# Patient Record
Sex: Female | Born: 1960
Health system: Southern US, Community
[De-identification: ages and names within clinical notes are randomized; demographics above are authoritative.]

## PROBLEM LIST (undated history)

## (undated) DIAGNOSIS — T7840XA Allergy, unspecified, initial encounter: Secondary | ICD-10-CM

## (undated) DIAGNOSIS — I10 Essential (primary) hypertension: Secondary | ICD-10-CM

## (undated) DIAGNOSIS — F419 Anxiety disorder, unspecified: Secondary | ICD-10-CM

## (undated) DIAGNOSIS — F329 Major depressive disorder, single episode, unspecified: Secondary | ICD-10-CM

## (undated) DIAGNOSIS — M1990 Unspecified osteoarthritis, unspecified site: Secondary | ICD-10-CM

## (undated) DIAGNOSIS — G43909 Migraine, unspecified, not intractable, without status migrainosus: Secondary | ICD-10-CM

## (undated) DIAGNOSIS — Z91018 Allergy to other foods: Secondary | ICD-10-CM

## (undated) DIAGNOSIS — F32A Depression, unspecified: Secondary | ICD-10-CM

## (undated) HISTORY — DX: Allergy, unspecified, initial encounter: T78.40XA

## (undated) HISTORY — DX: Unspecified osteoarthritis, unspecified site: M19.90

## (undated) HISTORY — DX: Allergy to other foods: Z91.018

## (undated) HISTORY — PX: APPENDECTOMY: SHX54

## (undated) HISTORY — DX: Migraine, unspecified, not intractable, without status migrainosus: G43.909

## (undated) HISTORY — DX: Major depressive disorder, single episode, unspecified: F32.9

## (undated) HISTORY — DX: Anxiety disorder, unspecified: F41.9

## (undated) HISTORY — PX: KIDNEY SURGERY: SHX687

## (undated) HISTORY — DX: Depression, unspecified: F32.A

---

## 1984-04-28 HISTORY — PX: TONSILLECTOMY: SUR1361

## 2012-11-22 LAB — HM COLONOSCOPY

## 2015-12-04 DIAGNOSIS — N870 Mild cervical dysplasia: Secondary | ICD-10-CM | POA: Diagnosis not present

## 2015-12-04 DIAGNOSIS — Z01419 Encounter for gynecological examination (general) (routine) without abnormal findings: Secondary | ICD-10-CM | POA: Diagnosis not present

## 2015-12-04 DIAGNOSIS — R87618 Other abnormal cytological findings on specimens from cervix uteri: Secondary | ICD-10-CM | POA: Diagnosis not present

## 2016-06-03 ENCOUNTER — Ambulatory Visit: Payer: Self-pay | Admitting: Internal Medicine

## 2016-07-01 ENCOUNTER — Ambulatory Visit (INDEPENDENT_AMBULATORY_CARE_PROVIDER_SITE_OTHER): Payer: BLUE CROSS/BLUE SHIELD | Admitting: Internal Medicine

## 2016-07-01 ENCOUNTER — Encounter: Payer: Self-pay | Admitting: Internal Medicine

## 2016-07-01 ENCOUNTER — Telehealth: Payer: Self-pay | Admitting: Internal Medicine

## 2016-07-01 ENCOUNTER — Other Ambulatory Visit (INDEPENDENT_AMBULATORY_CARE_PROVIDER_SITE_OTHER): Payer: BLUE CROSS/BLUE SHIELD

## 2016-07-01 VITALS — BP 154/104 | HR 60 | Temp 97.6°F | Resp 16 | Ht 65.0 in | Wt 196.0 lb

## 2016-07-01 DIAGNOSIS — F3289 Other specified depressive episodes: Secondary | ICD-10-CM

## 2016-07-01 DIAGNOSIS — F419 Anxiety disorder, unspecified: Secondary | ICD-10-CM | POA: Diagnosis not present

## 2016-07-01 DIAGNOSIS — E6609 Other obesity due to excess calories: Secondary | ICD-10-CM | POA: Diagnosis not present

## 2016-07-01 DIAGNOSIS — G43909 Migraine, unspecified, not intractable, without status migrainosus: Secondary | ICD-10-CM | POA: Insufficient documentation

## 2016-07-01 DIAGNOSIS — E669 Obesity, unspecified: Secondary | ICD-10-CM | POA: Insufficient documentation

## 2016-07-01 DIAGNOSIS — Z833 Family history of diabetes mellitus: Secondary | ICD-10-CM | POA: Diagnosis not present

## 2016-07-01 DIAGNOSIS — Z0000 Encounter for general adult medical examination without abnormal findings: Secondary | ICD-10-CM

## 2016-07-01 DIAGNOSIS — F329 Major depressive disorder, single episode, unspecified: Secondary | ICD-10-CM | POA: Insufficient documentation

## 2016-07-01 DIAGNOSIS — J309 Allergic rhinitis, unspecified: Secondary | ICD-10-CM | POA: Diagnosis not present

## 2016-07-01 DIAGNOSIS — G43809 Other migraine, not intractable, without status migrainosus: Secondary | ICD-10-CM

## 2016-07-01 DIAGNOSIS — Z6832 Body mass index (BMI) 32.0-32.9, adult: Secondary | ICD-10-CM | POA: Diagnosis not present

## 2016-07-01 DIAGNOSIS — R030 Elevated blood-pressure reading, without diagnosis of hypertension: Secondary | ICD-10-CM | POA: Insufficient documentation

## 2016-07-01 DIAGNOSIS — F32A Depression, unspecified: Secondary | ICD-10-CM | POA: Insufficient documentation

## 2016-07-01 LAB — HEMOGLOBIN A1C: HEMOGLOBIN A1C: 5.5 % (ref 4.6–6.5)

## 2016-07-01 LAB — COMPREHENSIVE METABOLIC PANEL
ALBUMIN: 4.2 g/dL (ref 3.5–5.2)
ALT: 20 U/L (ref 0–35)
AST: 20 U/L (ref 0–37)
Alkaline Phosphatase: 74 U/L (ref 39–117)
BILIRUBIN TOTAL: 0.4 mg/dL (ref 0.2–1.2)
BUN: 14 mg/dL (ref 6–23)
CHLORIDE: 105 meq/L (ref 96–112)
CO2: 27 meq/L (ref 19–32)
CREATININE: 0.64 mg/dL (ref 0.40–1.20)
Calcium: 8.9 mg/dL (ref 8.4–10.5)
GFR: 102.35 mL/min (ref >60.00)
Glucose, Bld: 109 mg/dL — ABNORMAL HIGH (ref 70–99)
Potassium: 3.7 mEq/L (ref 3.5–5.1)
SODIUM: 139 meq/L (ref 135–145)
Total Protein: 7 g/dL (ref 6.0–8.3)

## 2016-07-01 LAB — CBC WITH DIFFERENTIAL/PLATELET
BASOS PCT: 1.3 % (ref 0.0–3.0)
Basophils Absolute: 0.1 10*3/uL (ref 0.0–0.1)
Eosinophils Absolute: 0.4 10*3/uL (ref 0.0–0.7)
Eosinophils Relative: 6.6 % — ABNORMAL HIGH (ref 0.0–5.0)
HCT: 39.2 % (ref 36.0–46.0)
Hemoglobin: 13.4 g/dL (ref 12.0–15.0)
LYMPHS ABS: 2.3 10*3/uL (ref 0.7–4.0)
Lymphocytes Relative: 36.7 % (ref 12.0–46.0)
MCHC: 34.1 g/dL (ref 30.0–36.0)
MCV: 87.6 fl (ref 78.0–100.0)
MONO ABS: 0.4 10*3/uL (ref 0.1–1.0)
Monocytes Relative: 6.5 % (ref 3.0–12.0)
NEUTROS ABS: 3.1 10*3/uL (ref 1.4–7.7)
NEUTROS PCT: 48.9 % (ref 43.0–77.0)
PLATELETS: 289 10*3/uL (ref 150.0–400.0)
RBC: 4.48 Mil/uL (ref 3.87–5.11)
RDW: 13.5 % (ref 11.5–15.5)
WBC: 6.3 10*3/uL (ref 4.0–10.5)

## 2016-07-01 LAB — LIPID PANEL
CHOL/HDL RATIO: 4
CHOLESTEROL: 170 mg/dL (ref 0–200)
HDL: 45.4 mg/dL (ref >39.00)
LDL Cholesterol: 100 mg/dL — ABNORMAL HIGH (ref 0–99)
NonHDL: 124.6
Triglycerides: 125 mg/dL (ref 0.0–149.0)
VLDL: 25 mg/dL (ref 0.0–40.0)

## 2016-07-01 LAB — TSH: TSH: 3.31 u[IU]/mL (ref 0.35–4.50)

## 2016-07-01 MED ORDER — ALPRAZOLAM 0.5 MG PO TABS: 0.5000 mg | ORAL_TABLET | Freq: Every evening | ORAL | 1 refills | Status: DC | PRN

## 2016-07-01 MED ORDER — MECLIZINE HCL 25 MG PO TABS: 25.0000 mg | ORAL_TABLET | Freq: Three times a day (TID) | ORAL | 0 refills | Status: DC | PRN

## 2016-07-01 MED ORDER — BUPROPION HCL ER (XL) 150 MG PO TB24: 150.0000 mg | ORAL_TABLET | Freq: Every day | ORAL | 1 refills | Status: DC

## 2016-07-01 NOTE — Assessment & Plan Note (Addendum)
Mild depression continue celexa 20 mg dialy Start wellbutrin 150 mg daily - she has taken this in the past and tolerated it well Can increase wellbutrin to 300 mg daily

## 2016-07-01 NOTE — Assessment & Plan Note (Signed)
Takes imitrex as needed Last migraines was three years ago

## 2016-07-01 NOTE — Assessment & Plan Note (Signed)
Takes oral anti-histamine and flonase as needed

## 2016-07-01 NOTE — Assessment & Plan Note (Signed)
Check BP at home or gym - discussed goal Start exercise again Work on weight loss Low sodium diet Check labs

## 2016-07-01 NOTE — Assessment & Plan Note (Signed)
Stressed exercise Healthy diet, decreased portions

## 2016-07-01 NOTE — Progress Notes (Signed)
Subjective:    Patient ID: Andrea Oneal, female    DOB: 1961-01-14, 56 y.o.   MRN: [id number]  HPI She is here to establish with a new pcp.  She is here for a physical exam.    She moved from [place] recently.    Her BP is higher than usual.  She has increased stress.  She does not monitor her BP.  She has not been exercising recently and has gained weight.   Anxiety: She is taking her medication daily as prescribed. She denies any side effects from the medication. She feels her anxiety is well controlled and she is happy with her current dose of medication.   Depression: She is taking her medication daily as prescribed. She has some mild depression.  She denies any side effects from the medication. She states she could have a little more for her depression.    Medications and allergies reviewed with patient and updated if appropriate.  Patient Active Problem List   Diagnosis Date Noted  . Anxiety 07/01/2016  . Depression 07/01/2016  . Allergic rhinitis 07/01/2016  . Migraines 07/01/2016  . Elevated blood pressure reading 07/01/2016    No current outpatient prescriptions on file prior to visit.   No current facility-administered medications on file prior to visit.     Past Medical History:  Diagnosis Date  . Arthritis   . Depression   . Migraine     Past Surgical History:  Procedure Laterality Date  . APPENDECTOMY    . TONSILLECTOMY  1986    Social History   Social History  . Marital status: Divorced    Spouse name: N/A  . Number of children: N/A  . Years of education: N/A   Social History Main Topics  . Smoking status: Former Games developermoker  . Smokeless tobacco: Never Used  . Alcohol use 4.2 oz/week    7 Glasses of wine per week  . Drug use: Yes    Types: Marijuana  . Sexual activity: Not Asked   Other Topics Concern  . None   Social History Narrative   Has boyfriend, his daughter just moved in [id number]    Family History  Problem Relation Age of Onset  .  Arthritis Mother   . Diabetes Mother   . Depression Mother   . Arthritis Father   . Heart disease Father   . Hypertension Father   . Arthritis Maternal Grandmother   . Diabetes Maternal Grandfather   . Arthritis Paternal Grandmother   . Depression Brother     Review of Systems  Constitutional: Negative for chills, fatigue and fever.  Eyes: Negative for visual disturbance.  Respiratory: Negative for cough, shortness of breath and wheezing.   Cardiovascular: Negative for chest pain, palpitations and leg swelling.  Gastrointestinal: Negative for abdominal pain, blood in stool, constipation, diarrhea and nausea.       Recent gerd - added stress recently  Genitourinary: Negative for dysuria and hematuria.  Musculoskeletal: Positive for arthralgias (hands, feet - mild arthritis). Negative for back pain.  Skin: Negative for color change and rash.  Neurological: Positive for dizziness (occ) and headaches (sinus, rare migraines). Negative for light-headedness.       Objective:   Vitals:   07/01/16 0905  BP: (!) 154/104  Pulse: 60  Resp: 16  Temp: 97.6 F (36.4 C)   Filed Weights   07/01/16 0905  Weight: 196 lb (88.9 kg)   Body mass index is 32.62 kg/m.  Wt Readings from  Last 3 Encounters:  07/01/16 196 lb (88.9 kg)     Physical Exam Constitutional: She appears well-developed and well-nourished. No distress.  HENT:  Head: Normocephalic and atraumatic.  Right Ear: External ear normal. Normal ear canal and TM Left Ear: External ear normal.  Normal ear canal and TM Mouth/Throat: Oropharynx is clear and moist.  Eyes: Conjunctivae and EOM are normal.  Neck: Neck supple. No tracheal deviation present. No thyromegaly present.  No carotid bruit  Cardiovascular: Normal rate, regular rhythm and normal heart sounds.   No murmur heard.  No edema. Pulmonary/Chest: Effort normal and breath sounds normal. No respiratory distress. She has no wheezes. She has no rales.  Breast:  deferred to Gyn Abdominal: Soft. She exhibits no distension. There is no tenderness.  Lymphadenopathy: She has no cervical adenopathy.  Skin: Skin is warm and dry. She is not diaphoretic.  Psychiatric: She has a normal mood and affect. Her behavior is normal.         Assessment & Plan:   Physical exam: Screening blood work   ordered Immunizations  Up to date per patient Colonoscopy - 3 years ago Mammogram  Up to date  Gyn  Up to date   Eye exams   Up to date  Exercise - none - will start - has joined [facility] Weight - discussed weight loss at length Skin  - sees derm annually Substance abuse  - none  See Problem List for Assessment and Plan of chronic medical problems.  FU in one year, sooner if needed

## 2016-07-01 NOTE — Telephone Encounter (Signed)
Pt called checking on her prescriptions that were being sent in today. I told her that it looks like [facility] would have the Wellbutrin (they show confirmation this morning at 9:49am), the Xanax would need to be picked up (I do not see a script ready for pick up) and the Antivert says "Class: No Print". Please advise. Thanks [employer]

## 2016-07-01 NOTE — Patient Instructions (Addendum)
Monitor your BP - goal is less than 130/80.  Test(s) ordered today. Your results will be released to [place] (or called to you) after review, usually within 72hours after test completion. If any changes need to be made, you will be notified at that same time.  All other Health Maintenance issues reviewed.   All recommended immunizations and age-appropriate screenings are up-to-date or discussed.  No immunizations administered today.   Medications reviewed and updated.  Changes include starting wellbutrin 150 mg daily.    Your prescription(s) have been submitted to your pharmacy. Please take as directed and contact our office if you believe you are having problem(s) with the medication(s).   Please followup in one year for a physical.    Health Maintenance, Female Adopting a healthy lifestyle and getting preventive care can go a long way to promote health and wellness. Talk with your health care provider about what schedule of regular examinations is right for you. This is a good chance for you to check in with your provider about disease prevention and staying healthy. In between checkups, there are plenty of things you can do on your own. Experts have done a lot of research about which lifestyle changes and preventive measures are most likely to keep you healthy. Ask your health care provider for more information. Weight and diet Eat a healthy diet  Be sure to include plenty of vegetables, fruits, low-fat dairy products, and lean protein.  Do not eat a lot of foods high in solid fats, added sugars, or salt.  Get regular exercise. This is one of the most important things you can do for your health.  Most adults should exercise for at least 150 minutes each week. The exercise should increase your heart rate and make you sweat (moderate-intensity exercise).  Most adults should also do strengthening exercises at least twice a week. This is in addition to the moderate-intensity  exercise. Maintain a healthy weight  Body mass index (BMI) is a measurement that can be used to identify possible weight problems. It estimates body fat based on height and weight. Your health care provider can help determine your BMI and help you achieve or maintain a healthy weight.  For females 35 years of age and older:  A BMI below 18.5 is considered underweight.  A BMI of 18.5 to 24.9 is normal.  A BMI of 25 to 29.9 is considered overweight.  A BMI of 30 and above is considered obese. Watch levels of cholesterol and blood lipids  You should start having your blood tested for lipids and cholesterol at 55 years of age, then have this test every 5 years.  You may need to have your cholesterol levels checked more often if:  Your lipid or cholesterol levels are high.  You are older than 55 years of age.  You are at high risk for heart disease. Cancer screening Lung Cancer  Lung cancer screening is recommended for adults 46-49 years old who are at high risk for lung cancer because of a history of smoking.  A yearly low-dose CT scan of the lungs is recommended for people who:  Currently smoke.  Have quit within the past 15 years.  Have at least a 30-pack-year history of smoking. A pack year is smoking an average of one pack of cigarettes a day for 1 year.  Yearly screening should continue until it has been 15 years since you quit.  Yearly screening should stop if you develop a health problem that would  prevent you from having lung cancer treatment. Breast Cancer  Practice breast self-awareness. This means understanding how your breasts normally appear and feel.  It also means doing regular breast self-exams. Let your health care provider know about any changes, no matter how small.  If you are in your 20s or 30s, you should have a clinical breast exam (CBE) by a health care provider every 1-3 years as part of a regular health exam.  If you are 63 or older, have a CBE  every year. Also consider having a breast X-ray (mammogram) every year.  If you have a family history of breast cancer, talk to your health care provider about genetic screening.  If you are at high risk for breast cancer, talk to your health care provider about having an MRI and a mammogram every year.  Breast cancer gene (BRCA) assessment is recommended for women who have family members with BRCA-related cancers. BRCA-related cancers include:  Breast.  Ovarian.  Tubal.  Peritoneal cancers.  Results of the assessment will determine the need for genetic counseling and BRCA1 and BRCA2 testing. Cervical Cancer  Your health care provider may recommend that you be screened regularly for cancer of the pelvic organs (ovaries, uterus, and vagina). This screening involves a pelvic examination, including checking for microscopic changes to the surface of your cervix (Pap test). You may be encouraged to have this screening done every 3 years, beginning at age 77.  For women ages 39-65, health care providers may recommend pelvic exams and Pap testing every 3 years, or they may recommend the Pap and pelvic exam, combined with testing for human papilloma virus (HPV), every 5 years. Some types of HPV increase your risk of cervical cancer. Testing for HPV may also be done on women of any age with unclear Pap test results.  Other health care providers may not recommend any screening for nonpregnant women who are considered low risk for pelvic cancer and who do not have symptoms. Ask your health care provider if a screening pelvic exam is right for you.  If you have had past treatment for cervical cancer or a condition that could lead to cancer, you need Pap tests and screening for cancer for at least 20 years after your treatment. If Pap tests have been discontinued, your risk factors (such as having a new sexual partner) need to be reassessed to determine if screening should resume. Some women have medical  problems that increase the chance of getting cervical cancer. In these cases, your health care provider may recommend more frequent screening and Pap tests. Colorectal Cancer  This type of cancer can be detected and often prevented.  Routine colorectal cancer screening usually begins at 55 years of age and continues through 55 years of age.  Your health care provider may recommend screening at an earlier age if you have risk factors for colon cancer.  Your health care provider may also recommend using home test kits to check for hidden blood in the stool.  A small camera at the end of a tube can be used to examine your colon directly (sigmoidoscopy or colonoscopy). This is done to check for the earliest forms of colorectal cancer.  Routine screening usually begins at age 20.  Direct examination of the colon should be repeated every 5-10 years through 55 years of age. However, you may need to be screened more often if early forms of precancerous polyps or small growths are found. Skin Cancer  Check your skin from head  to toe regularly.  Tell your health care provider about any new moles or changes in moles, especially if there is a change in a mole's shape or color.  Also tell your health care provider if you have a mole that is larger than the size of a pencil eraser.  Always use sunscreen. Apply sunscreen liberally and repeatedly throughout the day.  Protect yourself by wearing long sleeves, pants, a wide-brimmed hat, and sunglasses whenever you are outside. Heart disease, diabetes, and high blood pressure  High blood pressure causes heart disease and increases the risk of stroke. High blood pressure is more likely to develop in:  People who have blood pressure in the high end of the normal range (130-139/85-89 mm Hg).  People who are overweight or obese.  People who are African American.  If you are 69-27 years of age, have your blood pressure checked every 3-5 years. If you  are 45 years of age or older, have your blood pressure checked every year. You should have your blood pressure measured twice-once when you are at a hospital or clinic, and once when you are not at a hospital or clinic. Record the average of the two measurements. To check your blood pressure when you are not at a hospital or clinic, you can use:  An automated blood pressure machine at a pharmacy.  A home blood pressure monitor.  If you are between 75 years and 70 years old, ask your health care provider if you should take aspirin to prevent strokes.  Have regular diabetes screenings. This involves taking a blood sample to check your fasting blood sugar level.  If you are at a normal weight and have a low risk for diabetes, have this test once every three years after 55 years of age.  If you are overweight and have a high risk for diabetes, consider being tested at a younger age or more often. Preventing infection Hepatitis B  If you have a higher risk for hepatitis B, you should be screened for this virus. You are considered at high risk for hepatitis B if:  You were born in a country where hepatitis B is common. Ask your health care provider which countries are considered high risk.  Your parents were born in a high-risk country, and you have not been immunized against hepatitis B (hepatitis B vaccine).  You have HIV or AIDS.  You use needles to inject street drugs.  You live with someone who has hepatitis B.  You have had sex with someone who has hepatitis B.  You get hemodialysis treatment.  You take certain medicines for conditions, including cancer, organ transplantation, and autoimmune conditions. Hepatitis C  Blood testing is recommended for:  Everyone born from [birth year] through 1964.  Anyone with known risk factors for hepatitis C. Sexually transmitted infections (STIs)  You should be screened for sexually transmitted infections (STIs) including gonorrhea and chlamydia  if:  You are sexually active and are younger than 55 years of age.  You are older than 55 years of age and your health care provider tells you that you are at risk for this type of infection.  Your sexual activity has changed since you were last screened and you are at an increased risk for chlamydia or gonorrhea. Ask your health care provider if you are at risk.  If you do not have HIV, but are at risk, it may be recommended that you take a prescription medicine daily to prevent HIV infection. This is  called pre-exposure prophylaxis (PrEP). You are considered at risk if:  You are sexually active and do not regularly use condoms or know the HIV status of your partner(s).  You take drugs by injection.  You are sexually active with a partner who has HIV. Talk with your health care provider about whether you are at high risk of being infected with HIV. If you choose to begin PrEP, you should first be tested for HIV. You should then be tested every 3 months for as long as you are taking PrEP. Pregnancy  If you are premenopausal and you may become pregnant, ask your health care provider about preconception counseling.  If you may become pregnant, take 400 to 800 micrograms (mcg) of folic acid every day.  If you want to prevent pregnancy, talk to your health care provider about birth control (contraception). Osteoporosis and menopause  Osteoporosis is a disease in which the bones lose minerals and strength with aging. This can result in serious bone fractures. Your risk for osteoporosis can be identified using a bone density scan.  If you are 16 years of age or older, or if you are at risk for osteoporosis and fractures, ask your health care provider if you should be screened.  Ask your health care provider whether you should take a calcium or vitamin D supplement to lower your risk for osteoporosis.  Menopause may have certain physical symptoms and risks.  Hormone replacement therapy may  reduce some of these symptoms and risks. Talk to your health care provider about whether hormone replacement therapy is right for you. Follow these instructions at home:  Schedule regular health, dental, and eye exams.  Stay current with your immunizations.  Do not use any tobacco products including cigarettes, chewing tobacco, or electronic cigarettes.  If you are pregnant, do not drink alcohol.  If you are breastfeeding, limit how much and how often you drink alcohol.  Limit alcohol intake to no more than 1 drink per day for nonpregnant women. One drink equals 12 ounces of beer, 5 ounces of wine, or 1 ounces of hard liquor.  Do not use street drugs.  Do not share needles.  Ask your health care provider for help if you need support or information about quitting drugs.  Tell your health care provider if you often feel depressed.  Tell your health care provider if you have ever been abused or do not feel safe at home. This information is not intended to replace advice given to you by your health care provider. Make sure you discuss any questions you have with your health care provider. Document Released: 10/28/2010 Document Revised: 09/20/2015 Document Reviewed: 01/16/2015 Elsevier Interactive Patient Education  2017 [employer].

## 2016-07-01 NOTE — Progress Notes (Signed)
Pre visit review using our clinic review tool, if applicable. No additional management support is needed unless otherwise documented below in the visit note. 

## 2016-07-01 NOTE — Assessment & Plan Note (Signed)
Takes celexa daily Takes xanax as needed Overall controlled

## 2016-07-02 ENCOUNTER — Encounter: Payer: Self-pay | Admitting: Internal Medicine

## 2016-07-02 MED ORDER — MECLIZINE HCL 25 MG PO TABS: 25.0000 mg | ORAL_TABLET | Freq: Three times a day (TID) | ORAL | 0 refills | Status: DC | PRN

## 2016-07-02 NOTE — Telephone Encounter (Signed)
[facility] Meclizine has been re-sent. LVM informing pt.

## 2016-10-31 ENCOUNTER — Other Ambulatory Visit: Payer: Self-pay | Admitting: *Deleted

## 2016-10-31 MED ORDER — CITALOPRAM HYDROBROMIDE 20 MG PO TABS: 20.0000 mg | ORAL_TABLET | Freq: Every day | ORAL | 0 refills | Status: DC

## 2016-10-31 MED ORDER — CITALOPRAM HYDROBROMIDE 20 MG PO TABS: 20.0000 mg | ORAL_TABLET | Freq: Every day | ORAL | 2 refills | Status: DC

## 2016-10-31 NOTE — Telephone Encounter (Signed)
Rec'd call pt states she had her cpx back in March was told to contact MD for refills when she need them. Pt states she is almost out of her celexa would [facility], [facility] Verified chart pt is up-to-dated sent to both pharmacy....Raechel Chute/lmb

## 2016-11-24 DIAGNOSIS — L237 Allergic contact dermatitis due to plants, except food: Secondary | ICD-10-CM | POA: Diagnosis not present

## 2017-01-22 ENCOUNTER — Other Ambulatory Visit: Payer: Self-pay | Admitting: Internal Medicine

## 2017-01-22 NOTE — Telephone Encounter (Signed)
Ok to fill. printed 

## 2017-01-22 NOTE — Telephone Encounter (Signed)
[place] Controlled Substance Database checked. Last filled on 07/01/16. Does not show on database that there were any refills.

## 2017-01-23 NOTE — Telephone Encounter (Signed)
RX faxed to local [facility] 

## 2017-03-16 ENCOUNTER — Telehealth: Payer: Self-pay | Admitting: Internal Medicine

## 2017-03-16 NOTE — Telephone Encounter (Signed)
Pt scheduled with Dr. Posey ReaPlotnikov on 03/18/2017

## 2017-03-16 NOTE — Telephone Encounter (Signed)
[facility] - Client TELEPHONE ADVICE RECORD [facility]  Patient Name: Andrea RoughenMELISSA Oneal  DOB: 09/27/1960        Nurse Assessment  Nurse: Odis LusterBowers, RN, Andrea Oneal Date/Time Andrea Oneal(Eastern Time): 03/16/2017 10:16:40 AM  Confirm and document reason for call. If symptomatic, describe symptoms. ---Caller reports that she thinks she has a hemorrhoid. Some rectal itching. Yesterday she had bloody mucous stool, none today. She reports that she had some abd pain but none today. Worried about this.  Does the patient have any new or worsening symptoms? ---Yes  Will a triage be completed? ---Yes  Related visit to physician within the last 2 weeks? ---No  Does the PT have any chronic conditions? (i.e. diabetes, asthma, etc.) ---Yes  List chronic conditions. ---anxiety/depression  Is this a behavioral health or substance abuse call? ---No     Guidelines    Guideline Title Affirmed Question Affirmed Notes  Rectal Bleeding MODERATE rectal bleeding (small blood clots, passing blood without stool, or toilet water turns red)    Final Disposition User   See Physician within 24 Hours [place], RN, Andrea LoserRhonda    Comments  returning nurses call.   Referrals  GO TO FACILITY UNDECIDED   Caller Disagree/Comply Comply  Caller Understands Yes  PreDisposition Did not know what to do

## 2017-03-18 ENCOUNTER — Encounter: Payer: Self-pay | Admitting: Internal Medicine

## 2017-03-18 ENCOUNTER — Ambulatory Visit: Payer: BLUE CROSS/BLUE SHIELD | Admitting: Internal Medicine

## 2017-03-18 DIAGNOSIS — K648 Other hemorrhoids: Secondary | ICD-10-CM | POA: Diagnosis not present

## 2017-03-18 DIAGNOSIS — K582 Mixed irritable bowel syndrome: Secondary | ICD-10-CM | POA: Diagnosis not present

## 2017-03-18 DIAGNOSIS — K589 Irritable bowel syndrome without diarrhea: Secondary | ICD-10-CM | POA: Insufficient documentation

## 2017-03-18 MED ORDER — HYDROCORTISONE ACETATE 25 MG RE SUPP: 25.0000 mg | Freq: Two times a day (BID) | RECTAL | 1 refills | Status: AC

## 2017-03-18 NOTE — Assessment & Plan Note (Addendum)
   Anoscopy: small internal hemorrhoid at 9 o'clock  Pt is reporting diarrhea, then took Imodium - constipated. There was abd pain on the R - nl. There was a blood clot in the stool and blood stained water on Sun once - small. C/o chronic IBS type symptoms. Pt gets diarrhea from non-organic beef and pork. [place] colon was in [place].   Sx's resolved Anusol HC supp if re-occurred

## 2017-03-18 NOTE — Patient Instructions (Signed)
Hemorrhoids Hemorrhoids are swollen veins in and around the rectum or anus. There are two types of hemorrhoids:  Internal hemorrhoids. These occur in the veins that are just inside the rectum. They may poke through to the outside and become irritated and painful.  External hemorrhoids. These occur in the veins that are outside of the anus and can be felt as a painful swelling or hard lump near the anus.  Most hemorrhoids do not cause serious problems, and they can be managed with home treatments such as diet and lifestyle changes. If home treatments do not help your symptoms, procedures can be done to shrink or remove the hemorrhoids. What are the causes? This condition is caused by increased pressure in the anal area. This pressure may result from various things, including:  Constipation.  Straining to have a bowel movement.  Diarrhea.  Pregnancy.  Obesity.  Sitting for long periods of time.  Heavy lifting or other activity that causes you to strain.  Anal sex.  What are the signs or symptoms? Symptoms of this condition include:  Pain.  Anal itching or irritation.  Rectal bleeding.  Leakage of stool (feces).  Anal swelling.  One or more lumps around the anus.  How is this diagnosed? This condition can often be diagnosed through a visual exam. Other exams or tests may also be done, such as:  Examination of the rectal area with a gloved hand (digital rectal exam).  Examination of the anal canal using a small tube (anoscope).  A blood test, if you have lost a significant amount of blood.  A test to look inside the colon (sigmoidoscopy or colonoscopy).  How is this treated? This condition can usually be treated at home. However, various procedures may be done if dietary changes, lifestyle changes, and other home treatments do not help your symptoms. These procedures can help make the hemorrhoids smaller or remove them completely. Some of these procedures involve  surgery, and others do not. Common procedures include:  Rubber band ligation. Rubber bands are placed at the base of the hemorrhoids to cut off the blood supply to them.  Sclerotherapy. Medicine is injected into the hemorrhoids to shrink them.  Infrared coagulation. A type of light energy is used to get rid of the hemorrhoids.  Hemorrhoidectomy surgery. The hemorrhoids are surgically removed, and the veins that supply them are tied off.  Stapled hemorrhoidopexy surgery. A circular stapling device is used to remove the hemorrhoids and use staples to cut off the blood supply to them.  Follow these instructions at home: Eating and drinking  Eat foods that have a lot of fiber in them, such as whole grains, beans, nuts, fruits, and vegetables. Ask your health care provider about taking products that have added fiber (fiber supplements).  Drink enough fluid to keep your urine clear or pale yellow. Managing pain and swelling  Take warm sitz baths for 20 minutes, 3-4 times a day to ease pain and discomfort.  If directed, apply ice to the affected area. Using ice packs between sitz baths may be helpful. ? Put ice in a plastic bag. ? Place a towel between your skin and the bag. ? Leave the ice on for 20 minutes, 2-3 times a day. General instructions  Take over-the-counter and prescription medicines only as told by your health care provider.  Use medicated creams or suppositories as told.  Exercise regularly.  Go to the bathroom when you have the urge to have a bowel movement. Do not wait.    Avoid straining to have bowel movements.  Keep the anal area dry and clean. Use wet toilet paper or moist towelettes after a bowel movement.  Do not sit on the toilet for long periods of time. This increases blood pooling and pain. Contact a health care provider if:  You have increasing pain and swelling that are not controlled by treatment or medicine.  You have uncontrolled bleeding.  You  have difficulty having a bowel movement, or you are unable to have a bowel movement.  You have pain or inflammation outside the area of the hemorrhoids. This information is not intended to replace advice given to you by your health care provider. Make sure you discuss any questions you have with your health care provider. Document Released: 04/11/2000 Document Revised: 09/12/2015 Document Reviewed: 12/27/2014 Elsevier Interactive Patient Education  2017 Elsevier Inc.  

## 2017-03-18 NOTE — Progress Notes (Signed)
Subjective:  Patient ID: Andrea Oneal, female    DOB: [birth year]  Age: 56 y.o. MRN: [id number]  CC: No chief complaint on file.   HPI Andrea Oneal presents for a possible hemorrhoid. Pt is reporting diarrhea, then took Imodium - constipated. There was abd pain on the R - nl. There was a blood clot in the stool and blood stained water on Sun once - small. C/o chronic IBS type symptoms. Pt gets diarrhea from non-organic beef and pork. [place] colon [place] in 2014 in [place].  Outpatient Medications Prior to Visit  Medication Sig Dispense Refill  . ALPRAZolam (XANAX) 0.5 MG tablet TAKE 1 TABLET BY MOUTH AT BEDTIME AS NEEDED FOR ANXIETY 30 tablet 1  . citalopram (CELEXA) 20 MG tablet Take 1 tablet (20 mg total) by mouth daily. 30 tablet 0  . fluticasone (FLONASE) 50 MCG/ACT nasal spray Place into both nostrils daily.    . meclizine (ANTIVERT) 25 MG tablet Take 1 tablet (25 mg total) by mouth 3 (three) times daily as needed for dizziness. (Patient not taking: Reported on 03/18/2017) 30 tablet 0  . buPROPion (WELLBUTRIN XL) 150 MG 24 hr tablet Take 1 tablet (150 mg total) by mouth daily. 90 tablet 1   No facility-administered medications prior to visit.     ROS Review of Systems  Constitutional: Negative for activity change, appetite change, chills, fatigue and unexpected weight change.  HENT: Negative for congestion, mouth sores and sinus pressure.   Eyes: Negative for visual disturbance.  Respiratory: Negative for cough and chest tightness.   Gastrointestinal: Positive for blood in stool, constipation and diarrhea. Negative for abdominal distention, abdominal pain, anal bleeding, nausea and rectal pain.  Genitourinary: Negative for difficulty urinating, frequency and vaginal pain.  Musculoskeletal: Negative for back pain and gait problem.  Skin: Negative for pallor and rash.  Neurological: Negative for dizziness, tremors, weakness, numbness and headaches.  Psychiatric/Behavioral:  Negative for confusion and sleep disturbance.    Objective:  BP (!) 144/88 (BP Location: Left Arm, Patient Position: Sitting, Cuff Size: Large)   Pulse 76   Temp 98.8 F (37.1 C) (Oral)   Ht 5\' 5"  (1.651 m)   Wt 200 lb (90.7 kg)   SpO2 99%   BMI 33.28 kg/m   BP Readings from Last 3 Encounters:  03/18/17 (!) 144/88  07/01/16 (!) 154/104    Wt Readings from Last 3 Encounters:  03/18/17 200 lb (90.7 kg)  07/01/16 196 lb (88.9 kg)    Physical Exam  Constitutional: She appears well-developed. No distress.  HENT:  Head: Normocephalic.  Right Ear: External ear normal.  Left Ear: External ear normal.  Nose: Nose normal.  Mouth/Throat: Oropharynx is clear and moist.  Eyes: Conjunctivae are normal. Pupils are equal, round, and reactive to light. Right eye exhibits no discharge. Left eye exhibits no discharge.  Neck: Normal range of motion. Neck supple. No JVD present. No tracheal deviation present. No thyromegaly present.  Cardiovascular: Normal rate, regular rhythm and normal heart sounds.  Pulmonary/Chest: No stridor. No respiratory distress. She has no wheezes.  Abdominal: Soft. Bowel sounds are normal. She exhibits no distension and no mass. There is no tenderness. There is no rebound and no guarding.  Musculoskeletal: She exhibits no edema or tenderness.  Lymphadenopathy:    She has no cervical adenopathy.  Neurological: She displays normal reflexes. No cranial nerve deficit. She exhibits normal muscle tone. Coordination normal.  Skin: No rash noted. No erythema.  Psychiatric: She has a  normal mood and affect. Her behavior is normal. Judgment and thought content normal.     Procedure: Anoscopy Indication: blood in stool Risks and benefits were explained to pt in detail. He was placed in lateral decubitus position. Digital rectal exam was normal  . Stool was guaiac negative. . Anoscope was introduced without difficulty. No masses. Upon withdrawal normal mucosa was  observed. There was an internal hemorrhoid at 9 o'clock. Impression: small internal hemorrhoid at 9 o'clock Tolerated well. Complications - none.  Lab Results  Component Value Date   WBC 6.3 07/01/2016   HGB 13.4 07/01/2016   HCT 39.2 07/01/2016   PLT 289.0 07/01/2016   GLUCOSE 109 (H) 07/01/2016   CHOL 170 07/01/2016   TRIG 125.0 07/01/2016   HDL 45.40 07/01/2016   LDLCALC 100 (H) 07/01/2016   ALT 20 07/01/2016   AST 20 07/01/2016   NA 139 07/01/2016   K 3.7 07/01/2016   CL 105 07/01/2016   CREATININE 0.64 07/01/2016   BUN 14 07/01/2016   CO2 27 07/01/2016   TSH 3.31 07/01/2016   HGBA1C 5.5 07/01/2016    Patient was never admitted.  Assessment & Plan:   There are no diagnoses linked to this encounter. I have discontinued Andrea Oneal's buPROPion. I am also having her maintain her meclizine, citalopram, ALPRAZolam, and fluticasone.  Meds ordered this encounter  Medications  . fluticasone (FLONASE) 50 MCG/ACT nasal spray    Sig: Place into both nostrils daily.     Follow-up: No Follow-up on file.  Sonda PrimesAlex Plotnikov, MD

## 2017-03-18 NOTE — Assessment & Plan Note (Signed)
Anoscopy: small internal hemorrhoid at 9 o'clock C/o chronic IBS type symptoms. Pt gets diarrhea from non-organic beef and pork. Discussed [place] colon was in 2014 in [place]. F/u w/Dr Lawerance BachBurns in 6 weeks

## 2017-07-28 ENCOUNTER — Other Ambulatory Visit: Payer: Self-pay | Admitting: Internal Medicine

## 2017-08-05 ENCOUNTER — Other Ambulatory Visit: Payer: Self-pay | Admitting: Internal Medicine

## 2017-08-05 MED ORDER — ALPRAZOLAM 0.5 MG PO TABS: 0.5000 mg | ORAL_TABLET | Freq: Every day | ORAL | 0 refills | Status: DC

## 2017-08-05 NOTE — Telephone Encounter (Signed)
[place] Controlled Substance Database checked. Last filled on 01/23/17 

## 2017-10-26 ENCOUNTER — Other Ambulatory Visit: Payer: Self-pay | Admitting: Internal Medicine

## 2017-11-04 DIAGNOSIS — J00 Acute nasopharyngitis [common cold]: Secondary | ICD-10-CM | POA: Diagnosis not present

## 2017-12-30 DIAGNOSIS — L237 Allergic contact dermatitis due to plants, except food: Secondary | ICD-10-CM | POA: Diagnosis not present

## 2018-01-19 ENCOUNTER — Encounter: Payer: BLUE CROSS/BLUE SHIELD | Admitting: Internal Medicine

## 2018-02-09 ENCOUNTER — Encounter: Payer: BLUE CROSS/BLUE SHIELD | Admitting: Internal Medicine

## 2018-02-19 ENCOUNTER — Encounter: Payer: BLUE CROSS/BLUE SHIELD | Admitting: Internal Medicine

## 2018-03-04 NOTE — Patient Instructions (Addendum)
[facility]  [address]  [place]  [place], [place] [address]  Main: [phone]   Tests ordered today. Your results will be released to [facility] (or called to you) after review, usually within 72hours after test completion. If any changes need to be made, you will be notified at that same time.  All other Health Maintenance issues reviewed.   All recommended immunizations and age-appropriate screenings are up-to-date or discussed.  No immunizations administered today.   Medications reviewed and updated.  Changes include :   Take celexa 10 mg daily for a few day to one week and then stop.  Start venlafaxine 37.5 mg daily.    Your prescription(s) have been submitted to your pharmacy. Please take as directed and contact our office if you believe you are having problem(s) with the medication(s).   Please followup in 6 months   Health Maintenance, Female Adopting a healthy lifestyle and getting preventive care can go a long way to promote health and wellness. Talk with your health care provider about what schedule of regular examinations is right for you. This is a good chance for you to check in with your provider about disease prevention and staying healthy. In between checkups, there are plenty of things you can do on your own. Experts have done a lot of research about which lifestyle changes and preventive measures are most likely to keep you healthy. Ask your health care provider for more information. Weight and diet Eat a healthy diet  Be sure to include plenty of vegetables, fruits, low-fat dairy products, and lean protein.  Do not eat a lot of foods high in solid fats, added sugars, or salt.  Get regular exercise. This is one of the most important things you can do for your health. ? Most adults should exercise for at least 150 minutes each week. The exercise should increase your heart rate and make you sweat (moderate-intensity exercise). ? Most adults  should also do strengthening exercises at least twice a week. This is in addition to the moderate-intensity exercise.  Maintain a healthy weight  Body mass index (BMI) is a measurement that can be used to identify possible weight problems. It estimates body fat based on height and weight. Your health care provider can help determine your BMI and help you achieve or maintain a healthy weight.  For females 2 years of age and older: ? A BMI below 18.5 is considered underweight. ? A BMI of 18.5 to 24.9 is normal. ? A BMI of 25 to 29.9 is considered overweight. ? A BMI of 30 and above is considered obese.  Watch levels of cholesterol and blood lipids  You should start having your blood tested for lipids and cholesterol at 56 years of age, then have this test every 5 years.  You may need to have your cholesterol levels checked more often if: ? Your lipid or cholesterol levels are high. ? You are older than 56 years of age. ? You are at high risk for heart disease.  Cancer screening Lung Cancer  Lung cancer screening is recommended for adults 33-37 years old who are at high risk for lung cancer because of a history of smoking.  A yearly low-dose CT scan of the lungs is recommended for people who: ? Currently smoke. ? Have quit within the past 15 years. ? Have at least a 30-pack-year history of smoking. A pack year is smoking an average of one pack of cigarettes a day for  1 year.  Yearly screening should continue until it has been 15 years since you quit.  Yearly screening should stop if you develop a health problem that would prevent you from having lung cancer treatment.  Breast Cancer  Practice breast self-awareness. This means understanding how your breasts normally appear and feel.  It also means doing regular breast self-exams. Let your health care provider know about any changes, no matter how small.  If you are in your 20s or 30s, you should have a clinical breast exam (CBE)  by a health care provider every 1-3 years as part of a regular health exam.  If you are 69 or older, have a CBE every year. Also consider having a breast X-ray (mammogram) every year.  If you have a family history of breast cancer, talk to your health care provider about genetic screening.  If you are at high risk for breast cancer, talk to your health care provider about having an MRI and a mammogram every year.  Breast cancer gene (BRCA) assessment is recommended for women who have family members with BRCA-related cancers. BRCA-related cancers include: ? Breast. ? Ovarian. ? Tubal. ? Peritoneal cancers.  Results of the assessment will determine the need for genetic counseling and BRCA1 and BRCA2 testing.  Cervical Cancer Your health care provider may recommend that you be screened regularly for cancer of the pelvic organs (ovaries, uterus, and vagina). This screening involves a pelvic examination, including checking for microscopic changes to the surface of your cervix (Pap test). You may be encouraged to have this screening done every 3 years, beginning at age 33.  For women ages 109-65, health care providers may recommend pelvic exams and Pap testing every 3 years, or they may recommend the Pap and pelvic exam, combined with testing for human papilloma virus (HPV), every 5 years. Some types of HPV increase your risk of cervical cancer. Testing for HPV may also be done on women of any age with unclear Pap test results.  Other health care providers may not recommend any screening for nonpregnant women who are considered low risk for pelvic cancer and who do not have symptoms. Ask your health care provider if a screening pelvic exam is right for you.  If you have had past treatment for cervical cancer or a condition that could lead to cancer, you need Pap tests and screening for cancer for at least 20 years after your treatment. If Pap tests have been discontinued, your risk factors (such as  having a new sexual partner) need to be reassessed to determine if screening should resume. Some women have medical problems that increase the chance of getting cervical cancer. In these cases, your health care provider may recommend more frequent screening and Pap tests.  Colorectal Cancer  This type of cancer can be detected and often prevented.  Routine colorectal cancer screening usually begins at 56 years of age and continues through 56 years of age.  Your health care provider may recommend screening at an earlier age if you have risk factors for colon cancer.  Your health care provider may also recommend using home test kits to check for hidden blood in the stool.  A small camera at the end of a tube can be used to examine your colon directly (sigmoidoscopy or colonoscopy). This is done to check for the earliest forms of colorectal cancer.  Routine screening usually begins at age 5.  Direct examination of the colon should be repeated every 5-10 years through 56  years of age. However, you may need to be screened more often if early forms of precancerous polyps or small growths are found.  Skin Cancer  Check your skin from head to toe regularly.  Tell your health care provider about any new moles or changes in moles, especially if there is a change in a mole's shape or color.  Also tell your health care provider if you have a mole that is larger than the size of a pencil eraser.  Always use sunscreen. Apply sunscreen liberally and repeatedly throughout the day.  Protect yourself by wearing long sleeves, pants, a wide-brimmed hat, and sunglasses whenever you are outside.  Heart disease, diabetes, and high blood pressure  High blood pressure causes heart disease and increases the risk of stroke. High blood pressure is more likely to develop in: ? People who have blood pressure in the high end of the normal range (130-139/85-89 mm Hg). ? People who are overweight or  obese. ? People who are African American.  If you are 82-93 years of age, have your blood pressure checked every 3-5 years. If you are 35 years of age or older, have your blood pressure checked every year. You should have your blood pressure measured twice-once when you are at a hospital or clinic, and once when you are not at a hospital or clinic. Record the average of the two measurements. To check your blood pressure when you are not at a hospital or clinic, you can use: ? An automated blood pressure machine at a pharmacy. ? A home blood pressure monitor.  If you are between 89 years and 74 years old, ask your health care provider if you should take aspirin to prevent strokes.  Have regular diabetes screenings. This involves taking a blood sample to check your fasting blood sugar level. ? If you are at a normal weight and have a low risk for diabetes, have this test once every three years after 56 years of age. ? If you are overweight and have a high risk for diabetes, consider being tested at a younger age or more often. Preventing infection Hepatitis B  If you have a higher risk for hepatitis B, you should be screened for this virus. You are considered at high risk for hepatitis B if: ? You were born in a country where hepatitis B is common. Ask your health care provider which countries are considered high risk. ? Your parents were born in a high-risk country, and you have not been immunized against hepatitis B (hepatitis B vaccine). ? You have HIV or AIDS. ? You use needles to inject street drugs. ? You live with someone who has hepatitis B. ? You have had sex with someone who has hepatitis B. ? You get hemodialysis treatment. ? You take certain medicines for conditions, including cancer, organ transplantation, and autoimmune conditions.  Hepatitis C  Blood testing is recommended for: ? Everyone born from [birth year] through 1964. ? Anyone with known risk factors for hepatitis  C.  Sexually transmitted infections (STIs)  You should be screened for sexually transmitted infections (STIs) including gonorrhea and chlamydia if: ? You are sexually active and are younger than 56 years of age. ? You are older than 56 years of age and your health care provider tells you that you are at risk for this type of infection. ? Your sexual activity has changed since you were last screened and you are at an increased risk for chlamydia or gonorrhea. Ask your health  care provider if you are at risk.  If you do not have HIV, but are at risk, it may be recommended that you take a prescription medicine daily to prevent HIV infection. This is called pre-exposure prophylaxis (PrEP). You are considered at risk if: ? You are sexually active and do not regularly use condoms or know the HIV status of your partner(s). ? You take drugs by injection. ? You are sexually active with a partner who has HIV.  Talk with your health care provider about whether you are at high risk of being infected with HIV. If you choose to begin PrEP, you should first be tested for HIV. You should then be tested every 3 months for as long as you are taking PrEP. Pregnancy  If you are premenopausal and you may become pregnant, ask your health care provider about preconception counseling.  If you may become pregnant, take 400 to 800 micrograms (mcg) of folic acid every day.  If you want to prevent pregnancy, talk to your health care provider about birth control (contraception). Osteoporosis and menopause  Osteoporosis is a disease in which the bones lose minerals and strength with aging. This can result in serious bone fractures. Your risk for osteoporosis can be identified using a bone density scan.  If you are 7 years of age or older, or if you are at risk for osteoporosis and fractures, ask your health care provider if you should be screened.  Ask your health care provider whether you should take a calcium or  vitamin D supplement to lower your risk for osteoporosis.  Menopause may have certain physical symptoms and risks.  Hormone replacement therapy may reduce some of these symptoms and risks. Talk to your health care provider about whether hormone replacement therapy is right for you. Follow these instructions at home:  Schedule regular health, dental, and eye exams.  Stay current with your immunizations.  Do not use any tobacco products including cigarettes, chewing tobacco, or electronic cigarettes.  If you are pregnant, do not drink alcohol.  If you are breastfeeding, limit how much and how often you drink alcohol.  Limit alcohol intake to no more than 1 drink per day for nonpregnant women. One drink equals 12 ounces of beer, 5 ounces of wine, or 1 ounces of hard liquor.  Do not use street drugs.  Do not share needles.  Ask your health care provider for help if you need support or information about quitting drugs.  Tell your health care provider if you often feel depressed.  Tell your health care provider if you have ever been abused or do not feel safe at home. This information is not intended to replace advice given to you by your health care provider. Make sure you discuss any questions you have with your health care provider. Document Released: 10/28/2010 Document Revised: 09/20/2015 Document Reviewed: 01/16/2015 Elsevier Interactive Patient Education  [employer].

## 2018-03-04 NOTE — Progress Notes (Signed)
Subjective:    Patient ID: Andrea Oneal, female    DOB: 04/14/1961, 57 y.o.   MRN: [id number]  HPI She is here for a physical exam.   Her eczema has flared up.  She has a steroid creams in the past and they did help.  She probably needs to go back to see the dermatologist, but would ideally like to get a prescription until she is able to set up an appointment.  Increased stress: She has been experiencing increased stress over the past few months.  Her boyfriend has multiple medical problems and she has been caring for him.  She is taking her medication daily as prescribed.  She rarely uses the Xanax.  She has not been exercising regularly and has gained weight.  She is also been drinking more alcohol compensation.  She is not happy about her weight.  Medications and allergies reviewed with patient and updated if appropriate.  Patient Active Problem List   Diagnosis Date Noted  . Internal hemorrhoid 03/18/2017  . IBS (irritable bowel syndrome) 03/18/2017  . Anxiety 07/01/2016  . Depression 07/01/2016  . Allergic rhinitis 07/01/2016  . Migraines 07/01/2016  . Elevated blood pressure reading 07/01/2016  . Obese 07/01/2016    Current Outpatient Medications on File Prior to Visit  Medication Sig Dispense Refill  . ALPRAZolam (XANAX) 0.5 MG tablet Take 1 tablet (0.5 mg total) by mouth at bedtime. -- Office visit needed for further refills 30 tablet 0  . citalopram (CELEXA) 20 MG tablet Take 1 tablet (20 mg total) by mouth daily. -- Office visit needed for further refills 90 tablet 0  . fluticasone (FLONASE) 50 MCG/ACT nasal spray Place into both nostrils daily.    . hydrocortisone (ANUSOL-HC) 25 MG suppository Place 1 suppository (25 mg total) rectally 2 (two) times daily. 20 suppository 1  . meclizine (ANTIVERT) 25 MG tablet Take 1 tablet (25 mg total) by mouth 3 (three) times daily as needed for dizziness. 30 tablet 0   No current facility-administered medications on file prior to  visit.     Past Medical History:  Diagnosis Date  . Arthritis   . Depression   . Migraine     Past Surgical History:  Procedure Laterality Date  . APPENDECTOMY    . TONSILLECTOMY  1986    Social History   Socioeconomic History  . Marital status: Divorced    Spouse name: Not on file  . Number of children: Not on file  . Years of education: Not on file  . Highest education level: Not on file  Occupational History  . Not on file  Social Needs  . Financial resource strain: Not on file  . Food insecurity:    Worry: Not on file    Inability: Not on file  . Transportation needs:    Medical: Not on file    Non-medical: Not on file  Tobacco Use  . Smoking status: Former Games developer  . Smokeless tobacco: Never Used  Substance and Sexual Activity  . Alcohol use: Yes    Alcohol/week: 7.0 standard drinks    Types: 7 Glasses of wine per week  . Drug use: Yes    Types: Marijuana  . Sexual activity: Not on file  Lifestyle  . Physical activity:    Days per week: Not on file    Minutes per session: Not on file  . Stress: Not on file  Relationships  . Social connections:    Talks on phone: Not on  file    Gets together: Not on file    Attends religious service: Not on file    Active member of club or organization: Not on file    Attends meetings of clubs or organizations: Not on file    Relationship status: Not on file  Other Topics Concern  . Not on file  Social History Narrative   Has boyfriend, his daughter just moved in -23    Family History  Problem Relation Age of Onset  . Arthritis Mother   . Diabetes Mother   . Depression Mother   . Arthritis Father   . Heart disease Father   . Hypertension Father   . Arthritis Maternal Grandmother   . Diabetes Maternal Grandfather   . Arthritis Paternal Grandmother   . Depression Brother     Review of Systems  Constitutional: Negative for chills and fever.  Eyes: Negative for visual disturbance.  Respiratory: Negative  for cough, shortness of breath and wheezing.   Cardiovascular: Negative for chest pain, palpitations and leg swelling.  Gastrointestinal: Positive for diarrhea (occ). Negative for abdominal pain, blood in stool, constipation and nausea.       No gerd  Genitourinary: Negative for dysuria and hematuria.  Musculoskeletal: Positive for arthralgias (arthritis).  Skin: Positive for rash (eczema).  Neurological: Positive for dizziness (rare) and headaches (occasional).  Psychiatric/Behavioral: Positive for dysphoric mood. The patient is nervous/anxious.        Objective:   Vitals:   03/05/18 1411  BP: (!) 144/90  Pulse: 66  Resp: 16  Temp: 98.6 F (37 C)  SpO2: 96%   Filed Weights   03/05/18 1411  Weight: 200 lb (90.7 kg)   Body mass index is 33.28 kg/m.  BP Readings from Last 3 Encounters:  03/05/18 (!) 144/90  03/18/17 (!) 144/88  07/01/16 (!) 154/104    Wt Readings from Last 3 Encounters:  03/05/18 200 lb (90.7 kg)  03/18/17 200 lb (90.7 kg)  07/01/16 196 lb (88.9 kg)     Physical Exam Constitutional: She appears well-developed and well-nourished. No distress.  HENT:  Head: Normocephalic and atraumatic.  Right Ear: External ear normal. Normal ear canal and TM Left Ear: External ear normal.  Normal ear canal and TM Mouth/Throat: Oropharynx is clear and moist.  Eyes: Conjunctivae and EOM are normal.  Neck: Neck supple. No tracheal deviation present. No thyromegaly present.  No carotid bruit  Cardiovascular: Normal rate, regular rhythm and normal heart sounds.   No murmur heard.  No edema. Pulmonary/Chest: Effort normal and breath sounds normal. No respiratory distress. She has no wheezes. She has no rales.  Breast: deferred to Gyn Abdominal: Soft. She exhibits no distension. There is no tenderness.  Lymphadenopathy: She has no cervical adenopathy.  Skin: Skin is warm and dry. She is not diaphoretic.  Patches of eczema-dry skin on back and behind Ears  especially Psychiatric: She has a normal mood and affect. Her behavior is normal.        Assessment & Plan:   Physical exam: Screening blood work  ordered Immunizations  Td up to date, flu shot up to date Colonoscopy   5 years ago 11/18/2012 Mammogram   Scheduled for November Gyn  - will establish with one - last saw in 2017 Eye exams  Up to date  Exercise none currently, but will restart Weight   discussed weight loss at length Skin   Sees derm annually Substance abuse   drinking too much alcohol-discussed the need to  decrease because of the concern of using it as a compensation for increased stress and he has it is contributing to her weight gain  See Problem List for Assessment and Plan of chronic medical problems.

## 2018-03-05 ENCOUNTER — Other Ambulatory Visit (INDEPENDENT_AMBULATORY_CARE_PROVIDER_SITE_OTHER): Payer: BLUE CROSS/BLUE SHIELD

## 2018-03-05 ENCOUNTER — Encounter: Payer: Self-pay | Admitting: Internal Medicine

## 2018-03-05 ENCOUNTER — Ambulatory Visit (INDEPENDENT_AMBULATORY_CARE_PROVIDER_SITE_OTHER): Payer: BLUE CROSS/BLUE SHIELD | Admitting: Internal Medicine

## 2018-03-05 VITALS — BP 144/90 | HR 66 | Temp 98.6°F | Resp 16 | Ht 65.0 in | Wt 200.0 lb

## 2018-03-05 DIAGNOSIS — R7303 Prediabetes: Secondary | ICD-10-CM | POA: Insufficient documentation

## 2018-03-05 DIAGNOSIS — Z0000 Encounter for general adult medical examination without abnormal findings: Secondary | ICD-10-CM

## 2018-03-05 DIAGNOSIS — L309 Dermatitis, unspecified: Secondary | ICD-10-CM | POA: Diagnosis not present

## 2018-03-05 DIAGNOSIS — R739 Hyperglycemia, unspecified: Secondary | ICD-10-CM | POA: Diagnosis not present

## 2018-03-05 DIAGNOSIS — R030 Elevated blood-pressure reading, without diagnosis of hypertension: Secondary | ICD-10-CM | POA: Diagnosis not present

## 2018-03-05 DIAGNOSIS — F419 Anxiety disorder, unspecified: Secondary | ICD-10-CM

## 2018-03-05 DIAGNOSIS — F3289 Other specified depressive episodes: Secondary | ICD-10-CM | POA: Diagnosis not present

## 2018-03-05 LAB — CBC WITH DIFFERENTIAL/PLATELET
BASOS ABS: 0.1 10*3/uL (ref 0.0–0.1)
Basophils Relative: 1.5 % (ref 0.0–3.0)
Eosinophils Absolute: 0.3 10*3/uL (ref 0.0–0.7)
Eosinophils Relative: 5.4 % — ABNORMAL HIGH (ref 0.0–5.0)
HEMATOCRIT: 40.8 % (ref 36.0–46.0)
Hemoglobin: 13.9 g/dL (ref 12.0–15.0)
LYMPHS PCT: 32.6 % (ref 12.0–46.0)
Lymphs Abs: 2.1 10*3/uL (ref 0.7–4.0)
MCHC: 34.1 g/dL (ref 30.0–36.0)
MCV: 88.7 fl (ref 78.0–100.0)
MONOS PCT: 6.4 % (ref 3.0–12.0)
Monocytes Absolute: 0.4 10*3/uL (ref 0.1–1.0)
NEUTROS PCT: 54.1 % (ref 43.0–77.0)
Neutro Abs: 3.5 10*3/uL (ref 1.4–7.7)
Platelets: 264 10*3/uL (ref 150.0–400.0)
RBC: 4.6 Mil/uL (ref 3.87–5.11)
RDW: 13.3 % (ref 11.5–15.5)
WBC: 6.5 10*3/uL (ref 4.0–10.5)

## 2018-03-05 LAB — COMPREHENSIVE METABOLIC PANEL
ALBUMIN: 4.5 g/dL (ref 3.5–5.2)
ALK PHOS: 67 U/L (ref 39–117)
ALT: 24 U/L (ref 0–35)
AST: 23 U/L (ref 0–37)
BILIRUBIN TOTAL: 0.5 mg/dL (ref 0.2–1.2)
BUN: 11 mg/dL (ref 6–23)
CALCIUM: 9.3 mg/dL (ref 8.4–10.5)
CO2: 31 mEq/L (ref 19–32)
Chloride: 102 mEq/L (ref 96–112)
Creatinine, Ser: 0.71 mg/dL (ref 0.40–1.20)
GFR: 90.25 mL/min (ref >60.00)
Glucose, Bld: 95 mg/dL (ref 70–99)
Potassium: 4.3 mEq/L (ref 3.5–5.1)
Sodium: 139 mEq/L (ref 135–145)
TOTAL PROTEIN: 7.4 g/dL (ref 6.0–8.3)

## 2018-03-05 LAB — TSH: TSH: 2.2 u[IU]/mL (ref 0.35–4.50)

## 2018-03-05 LAB — LIPID PANEL
CHOL/HDL RATIO: 4
Cholesterol: 194 mg/dL (ref 0–200)
HDL: 49.9 mg/dL (ref >39.00)
LDL Cholesterol: 123 mg/dL — ABNORMAL HIGH (ref 0–99)
NonHDL: 144.24
TRIGLYCERIDES: 104 mg/dL (ref 0.0–149.0)
VLDL: 20.8 mg/dL (ref 0.0–40.0)

## 2018-03-05 LAB — HEMOGLOBIN A1C: Hgb A1c MFr Bld: 5.5 % (ref 4.6–6.5)

## 2018-03-05 MED ORDER — ALPRAZOLAM 0.5 MG PO TABS: 0.5000 mg | ORAL_TABLET | Freq: Every day | ORAL | 0 refills | Status: DC

## 2018-03-05 MED ORDER — VENLAFAXINE HCL ER 37.5 MG PO CP24: 37.5000 mg | ORAL_CAPSULE | Freq: Every day | ORAL | 5 refills | Status: DC

## 2018-03-05 MED ORDER — DESOXIMETASONE 0.05 % EX CREA: TOPICAL_CREAM | Freq: Two times a day (BID) | CUTANEOUS | 0 refills | Status: DC

## 2018-03-05 NOTE — Assessment & Plan Note (Addendum)
Has had increased anxiety Has compensated with increasing her alcohol intake we discussed this-she will try to decrease it ?  Celexa working as good as it could be-we will taper off and start Effexor 37.5 mg daily-she will let me know if she wants to increase this Takes xanax only as needed, continue Discussed regular exercise and other ways of compensating for the stress

## 2018-03-05 NOTE — Assessment & Plan Note (Addendum)
BP at home < 140/70-80 She will continue to monitor at home Stressed weight loss Start regular exercise Improve diet Follow-up in 6 months

## 2018-03-06 NOTE — Assessment & Plan Note (Signed)
She has been using moisturizer cream without improvement In the past she used Topicort cream-we will refill she will make an appointment with dermatologist Continue daily moisturizer creams

## 2018-03-06 NOTE — Assessment & Plan Note (Signed)
A1c Discussed the importance of improving her diet, working on weight loss and exercising regularly

## 2018-03-06 NOTE — Assessment & Plan Note (Signed)
Depression is not as much of an issue his anxiety is We will change Celexa to Effexor for anxiety Start Effexor 37.5 mg daily

## 2018-03-07 ENCOUNTER — Encounter: Payer: Self-pay | Admitting: Internal Medicine

## 2018-03-09 ENCOUNTER — Encounter: Payer: Self-pay | Admitting: Internal Medicine

## 2018-03-09 ENCOUNTER — Telehealth: Payer: Self-pay | Admitting: *Deleted

## 2018-03-09 NOTE — Telephone Encounter (Signed)
Desoximetasone 0.05% PA is approved via CoverMyMeds.  KeyVonda [place]: [id number] - PA Case ID: [id number] Pharmacy and patient informed.

## 2018-03-17 ENCOUNTER — Other Ambulatory Visit: Payer: Self-pay | Admitting: Internal Medicine

## 2018-03-17 DIAGNOSIS — Z1231 Encounter for screening mammogram for malignant neoplasm of breast: Secondary | ICD-10-CM

## 2018-03-22 ENCOUNTER — Other Ambulatory Visit: Payer: Self-pay | Admitting: Internal Medicine

## 2018-03-22 ENCOUNTER — Ambulatory Visit
Admission: RE | Admit: 2018-03-22 | Discharge: 2018-03-22 | Disposition: A | Discharge status: Home / Self Care | Payer: BLUE CROSS/BLUE SHIELD | Source: Ambulatory Visit | Attending: Internal Medicine | Admitting: Internal Medicine

## 2018-03-22 DIAGNOSIS — Z1231 Encounter for screening mammogram for malignant neoplasm of breast: Secondary | ICD-10-CM | POA: Diagnosis not present

## 2018-03-30 ENCOUNTER — Other Ambulatory Visit: Payer: Self-pay | Admitting: Internal Medicine

## 2018-04-26 ENCOUNTER — Telehealth: Payer: BLUE CROSS/BLUE SHIELD | Admitting: Family

## 2018-04-26 DIAGNOSIS — B9689 Other specified bacterial agents as the cause of diseases classified elsewhere: Secondary | ICD-10-CM

## 2018-04-26 DIAGNOSIS — J208 Acute bronchitis due to other specified organisms: Secondary | ICD-10-CM

## 2018-04-26 MED ORDER — ALBUTEROL SULFATE HFA 108 (90 BASE) MCG/ACT IN AERS: 2.0000 | INHALATION_SPRAY | Freq: Four times a day (QID) | RESPIRATORY_TRACT | 0 refills | Status: DC | PRN

## 2018-04-26 MED ORDER — AZITHROMYCIN 250 MG PO TABS: ORAL_TABLET | ORAL | 0 refills | Status: DC

## 2018-04-26 MED ORDER — PREDNISONE 5 MG PO TABS: 5.0000 mg | ORAL_TABLET | ORAL | 0 refills | Status: DC

## 2018-04-26 MED ORDER — BENZONATATE 100 MG PO CAPS: 100.0000 mg | ORAL_CAPSULE | Freq: Three times a day (TID) | ORAL | 0 refills | Status: DC | PRN

## 2018-04-26 NOTE — Addendum Note (Signed)
Addended by: Beau FannyWITHROW, Danise Dehne C on: 04/26/2018 01:47 PM   Modules accepted: Orders

## 2018-04-26 NOTE — Progress Notes (Signed)
ADDENDUM: Pt reports updated symptoms of 2 weeks now. Will change to bacterial treatment plan.  We are sorry that you are not feeling well.  Here is how we plan to help!  Based on your presentation I believe you most likely have A cough due to bacteria.  When patients have a fever and a productive cough with a change in color or increased sputum production, we are concerned about bacterial bronchitis.  If left untreated it can progress to pneumonia.  If your symptoms do not improve with your treatment plan it is important that you contact your provider.   I have prescribed Azithromyin 250 mg: two tablets now and then one tablet daily for 4 additonal days    In addition you may use A non-prescription cough medication called Mucinex DM: take 2 tablets every 12 hours.  Prednisone 5 mg daily for 6 days (see taper instructions below)  Directions for 6 day taper: Day 1: 2 tablets before breakfast, 1 after both lunch & dinner and 2 at bedtime Day 2: 1 tab before breakfast, 1 after both lunch & dinner and 2 at bedtime Day 3: 1 tab at each meal & 1 at bedtime Day 4: 1 tab at breakfast, 1 at lunch, 1 at bedtime Day 5: 1 tab at breakfast & 1 tab at bedtime Day 6: 1 tab at breakfast   From your responses in the eVisit questionnaire you describe inflammation in the upper respiratory tract which is causing a significant cough.  This is commonly called Bronchitis and has four common causes:    Allergies  Viral Infections  Acid Reflux  Bacterial Infection Allergies, viruses and acid reflux are treated by controlling symptoms or eliminating the cause. An example might be a cough caused by taking certain blood pressure medications. You stop the cough by changing the medication. Another example might be a cough caused by acid reflux. Controlling the reflux helps control the cough.  USE OF BRONCHODILATOR ("RESCUE") INHALERS: There is a risk from using your bronchodilator too frequently.  The risk is that  over-reliance on a medication which only relaxes the muscles surrounding the breathing tubes can reduce the effectiveness of medications prescribed to reduce swelling and congestion of the tubes themselves.  Although you feel brief relief from the bronchodilator inhaler, your asthma may actually be worsening with the tubes becoming more swollen and filled with mucus.  This can delay other crucial treatments, such as oral steroid medications. If you need to use a bronchodilator inhaler daily, several times per day, you should discuss this with your provider.  There are probably better treatments that could be used to keep your asthma under control.     HOME CARE . Only take medications as instructed by your medical team. . Complete the entire course of an antibiotic. . Drink plenty of fluids and get plenty of rest. . Avoid close contacts especially the very young and the elderly . Cover your mouth if you cough or cough into your sleeve. . Always remember to wash your hands . A steam or ultrasonic humidifier can help congestion.   GET HELP RIGHT AWAY IF: . You develop worsening fever. . You become short of breath . You cough up blood. . Your symptoms persist after you have completed your treatment plan MAKE SURE YOU   Understand these instructions.  Will watch your condition.  Will get help right away if you are not doing well or get worse.  Your e-visit answers were reviewed by a  board certified advanced clinical practitioner to complete your personal care plan.  Depending on the condition, your plan could have included both over the counter or prescription medications. If there is a problem please reply  once you have received a response from your provider. Your safety is important to us.  If you have drug allergies check your prescription carefully.    You can use MyChart to ask questions about today's visit, request a non-urgent call back, or ask for a work or school excuse for 24 hours  related to this e-Visit. If it has been greater than 24 hours you will need to follow up with your provider, or enter a new e-Visit to address those concerns. You will get an e-mail in the next two days asking about your experience.  I hope that your e-visit has been valuable and will speed your recovery. Thank you for using e-visits.

## 2018-04-26 NOTE — Progress Notes (Signed)
Thank you for the details you included in the comment boxes. Those details are very helpful in determining the best course of treatment for you and help us to provide the best care. This is most likely a viral infection that must run its course, but is likely not influenza. Also, after 48-72h, the flu treatment is far less effective. If you have been sick 5-7 days, please reply to this and update our team and we will modify. See plan below.   Also, if the diarrhea is more then 3 times and continues, please do another e-visit so that we have a record (this is a [facility] policy for documentation and safety reasons to provide the best care to patients)  We are sorry that you are not feeling well.  Here is how we plan to help!  Based on your presentation I believe you most likely have A cough due to a virus.  This is called viral bronchitis and is best treated by rest, plenty of fluids and control of the cough.  You may use Ibuprofen or Tylenol as directed to help your symptoms.     In addition you may use A non-prescription cough medication called Mucinex DM: take 2 tablets every 12 hours. and A prescription cough medication called Tessalon Perles 100mg . You may take 1-2 capsules every 8 hours as needed for your cough.   From your responses in the eVisit questionnaire you describe inflammation in the upper respiratory tract which is causing a significant cough.  This is commonly called Bronchitis and has four common causes:    Allergies  Viral Infections  Acid Reflux  Bacterial Infection Allergies, viruses and acid reflux are treated by controlling symptoms or eliminating the cause. An example might be a cough caused by taking certain blood pressure medications. You stop the cough by changing the medication. Another example might be a cough caused by acid reflux. Controlling the reflux helps control the cough.  USE OF BRONCHODILATOR ("RESCUE") INHALERS: There is a risk from using your bronchodilator  too frequently.  The risk is that over-reliance on a medication which only relaxes the muscles surrounding the breathing tubes can reduce the effectiveness of medications prescribed to reduce swelling and congestion of the tubes themselves.  Although you feel brief relief from the bronchodilator inhaler, your asthma may actually be worsening with the tubes becoming more swollen and filled with mucus.  This can delay other crucial treatments, such as oral steroid medications. If you need to use a bronchodilator inhaler daily, several times per day, you should discuss this with your provider.  There are probably better treatments that could be used to keep your asthma under control.     HOME CARE . Only take medications as instructed by your medical team. . Complete the entire course of an antibiotic. . Drink plenty of fluids and get plenty of rest. . Avoid close contacts especially the very young and the elderly . Cover your mouth if you cough or cough into your sleeve. . Always remember to wash your hands . A steam or ultrasonic humidifier can help congestion.   GET HELP RIGHT AWAY IF: . You develop worsening fever. . You become short of breath . You cough up blood. . Your symptoms persist after you have completed your treatment plan MAKE SURE YOU   Understand these instructions.  Will watch your condition.  Will get help right away if you are not doing well or get worse.  Your e-visit answers were reviewed by  a board certified advanced clinical practitioner to complete your personal care plan.  Depending on the condition, your plan could have included both over the counter or prescription medications. If there is a problem please reply  once you have received a response from your provider. Your safety is important to us.  If you have drug allergies check your prescription carefully.    You can use MyChart to ask questions about today's visit, request a non-urgent call back, or ask for a  work or school excuse for 24 hours related to this e-Visit. If it has been greater than 24 hours you will need to follow up with your provider, or enter a new e-Visit to address those concerns. You will get an e-mail in the next two days asking about your experience.  I hope that your e-visit has been valuable and will speed your recovery. Thank you for using e-visits.

## 2018-05-15 ENCOUNTER — Other Ambulatory Visit: Payer: Self-pay

## 2018-05-15 ENCOUNTER — Other Ambulatory Visit: Payer: Self-pay | Admitting: Internal Medicine

## 2018-05-15 DIAGNOSIS — J208 Acute bronchitis due to other specified organisms: Secondary | ICD-10-CM

## 2018-05-17 MED ORDER — DESOXIMETASONE 0.05 % EX CREA: TOPICAL_CREAM | Freq: Two times a day (BID) | CUTANEOUS | 0 refills | Status: DC

## 2018-05-17 MED ORDER — ALPRAZOLAM 0.5 MG PO TABS: 0.5000 mg | ORAL_TABLET | Freq: Every day | ORAL | 0 refills | Status: DC

## 2018-05-17 NOTE — Telephone Encounter (Signed)
Last refill was 03/05/18 [facility] 03/05/18 Next OV 09/06/18

## 2018-06-15 ENCOUNTER — Ambulatory Visit
Admission: EM | Admit: 2018-06-15 | Discharge: 2018-06-15 | Disposition: A | Discharge status: Home / Self Care | Payer: BLUE CROSS/BLUE SHIELD | Attending: Family Medicine | Admitting: Family Medicine

## 2018-06-15 DIAGNOSIS — R51 Headache: Secondary | ICD-10-CM

## 2018-06-15 DIAGNOSIS — R109 Unspecified abdominal pain: Secondary | ICD-10-CM

## 2018-06-15 DIAGNOSIS — R05 Cough: Secondary | ICD-10-CM | POA: Diagnosis not present

## 2018-06-15 DIAGNOSIS — J111 Influenza due to unidentified influenza virus with other respiratory manifestations: Secondary | ICD-10-CM

## 2018-06-15 DIAGNOSIS — R69 Illness, unspecified: Principal | ICD-10-CM

## 2018-06-15 MED ORDER — BENZONATATE 100 MG PO CAPS: 100.0000 mg | ORAL_CAPSULE | Freq: Three times a day (TID) | ORAL | 0 refills | Status: DC | PRN

## 2018-06-15 MED ORDER — ONDANSETRON 8 MG PO TBDP: 8.0000 mg | ORAL_TABLET | Freq: Three times a day (TID) | ORAL | 0 refills | Status: DC | PRN

## 2018-06-15 MED ORDER — OSELTAMIVIR PHOSPHATE 75 MG PO CAPS: 75.0000 mg | ORAL_CAPSULE | Freq: Two times a day (BID) | ORAL | 0 refills | Status: DC

## 2018-06-15 NOTE — ED Provider Notes (Signed)
[facility]    CSN: [id number] Arrival date & time: 06/15/18  1155     History   Chief Complaint Chief Complaint  Patient presents with  . Cough    HPI Andrea Oneal is a 58 y.o. female.   Pt c/o vomiting and diarrhea yesterday and today having a headache, cough and abdominal cramping.  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  Had fever yesterday     Past Medical History:  Diagnosis Date  . Arthritis   . Depression   . Migraine     Patient Active Problem List   Diagnosis Date Noted  . Eczema 03/05/2018  . Hyperglycemia 03/05/2018  . Internal hemorrhoid 03/18/2017  . IBS (irritable bowel syndrome) 03/18/2017  . Anxiety 07/01/2016  . Depression 07/01/2016  . Allergic rhinitis 07/01/2016  . Migraines 07/01/2016  . Elevated blood pressure reading 07/01/2016  . Obese 07/01/2016    Past Surgical History:  Procedure Laterality Date  . APPENDECTOMY    . TONSILLECTOMY  1986    OB History   No obstetric history on file.      Home Medications    Prior to Admission medications   Medication Sig Start Date End Date Taking? Authorizing Provider  ALPRAZolam Prudy Feeler) 0.5 MG tablet Take 1 tablet (0.5 mg total) by mouth at bedtime. 05/17/18   Pincus Sanes, MD  benzonatate (TESSALON) 100 MG capsule Take 1-2 capsules (100-200 mg total) by mouth 3 (three) times daily as needed for cough. 06/15/18   Elvina Sidle, MD  ondansetron (ZOFRAN-ODT) 8 MG disintegrating tablet Take 1 tablet (8 mg total) by mouth every 8 (eight) hours as needed for nausea. 06/15/18   Elvina Sidle, MD  oseltamivir (TAMIFLU) 75 MG capsule Take 1 capsule (75 mg total) by mouth every 12 (twelve) hours. 06/15/18   Elvina Sidle, MD  venlafaxine XR (EFFEXOR-XR) 37.5 MG 24 hr capsule TAKE 1 CAPSULE BY MOUTH EVERY DAY WITH BREAKFAST 03/30/18   Pincus Sanes, MD    Family History Family History  Problem Relation Age of Onset  . Arthritis Mother   . Diabetes  Mother   . Depression Mother   . Arthritis Father   . Heart disease Father   . Hypertension Father   . Arthritis Maternal Grandmother   . Diabetes Maternal Grandfather   . Arthritis Paternal Grandmother   . Depression Brother     Social History Social History   Tobacco Use  . Smoking status: Former Games developer  . Smokeless tobacco: Never Used  Substance Use Topics  . Alcohol use: Yes    Alcohol/week: 7.0 standard drinks    Types: 7 Glasses of wine per week  . Drug use: Yes    Types: Marijuana     Allergies   Patient has no known allergies.   Review of Systems Review of Systems   Physical Exam Triage Vital Signs ED Triage Vitals [06/15/18 1200]  Enc Vitals Group     BP (!) 155/94     Pulse Rate 65     Resp 18     Temp 98.5 F (36.9 C)     Temp Source Oral     SpO2 96 %     Weight      Height      Head Circumference      Peak Flow      Pain Score 7     Pain Loc      Pain Edu?  Excl. in GC?    No data found.  Updated Vital Signs BP (!) 155/94 (BP Location: Left Arm)   Pulse 65   Temp 98.5 F (36.9 C) (Oral)   Resp 18   SpO2 96%    Physical Exam Vitals signs and nursing note reviewed.  Constitutional:      Appearance: Normal appearance. She is obese.  HENT:     Head: Normocephalic.     Right Ear: Tympanic membrane and external ear normal.     Left Ear: Tympanic membrane and external ear normal.     Nose: Nose normal.     Mouth/Throat:     Mouth: Mucous membranes are moist.     Pharynx: Oropharynx is clear.  Eyes:     Conjunctiva/sclera: Conjunctivae normal.  Neck:     Musculoskeletal: Normal range of motion.  Cardiovascular:     Rate and Rhythm: Normal rate and regular rhythm.     Heart sounds: Normal heart sounds.  Pulmonary:     Effort: Pulmonary effort is normal.     Breath sounds: Normal breath sounds.  Abdominal:     General: Bowel sounds are normal.     Tenderness: There is no abdominal tenderness.  Musculoskeletal: Normal  range of motion.  Skin:    General: Skin is warm and dry.  Neurological:     General: No focal deficit present.     Mental Status: She is alert.  Psychiatric:        Mood and Affect: Mood normal.        Thought Content: Thought content normal.      UC Treatments / Results  Labs (all labs ordered are listed, but only abnormal results are displayed) Labs Reviewed - No data to display  EKG None  Radiology No results found.  Procedures Procedures (including critical care time)  Medications Ordered in UC Medications - No data to display  Initial Impression / Assessment and Plan / UC Course  I have reviewed the triage vital signs and the nursing notes.  Pertinent labs & imaging results that were available during my care of the patient were reviewed by me and considered in my medical decision making (see chart for details).    Final Clinical Impressions(s) / UC Diagnoses   Final diagnoses:  Influenza-like illness   Discharge Instructions   None    ED Prescriptions    Medication Sig Dispense Auth. Provider   oseltamivir (TAMIFLU) 75 MG capsule Take 1 capsule (75 mg total) by mouth every 12 (twelve) hours. 10 capsule Elvina Sidle, MD   ondansetron (ZOFRAN-ODT) 8 MG disintegrating tablet Take 1 tablet (8 mg total) by mouth every 8 (eight) hours as needed for nausea. 12 tablet Elvina Sidle, MD   benzonatate (TESSALON) 100 MG capsule Take 1-2 capsules (100-200 mg total) by mouth 3 (three) times daily as needed for cough. 40 capsule Elvina Sidle, MD     Controlled Substance Prescriptions [place] Controlled Substance Registry consulted? Not Applicable   Elvina Sidle, MD 06/15/18 1227

## 2018-06-15 NOTE — ED Triage Notes (Signed)
Pt c/o vomiting and diarrhea yesterday and today having a headache, cough and abdominal cramping

## 2018-06-16 ENCOUNTER — Ambulatory Visit: Payer: BLUE CROSS/BLUE SHIELD | Admitting: Nurse Practitioner

## 2018-07-06 ENCOUNTER — Ambulatory Visit
Admission: EM | Admit: 2018-07-06 | Discharge: 2018-07-06 | Disposition: A | Discharge status: Home / Self Care | Payer: BLUE CROSS/BLUE SHIELD

## 2018-07-06 ENCOUNTER — Encounter: Payer: Self-pay | Admitting: Emergency Medicine

## 2018-07-06 ENCOUNTER — Encounter: Payer: Self-pay | Admitting: Internal Medicine

## 2018-07-06 DIAGNOSIS — B349 Viral infection, unspecified: Secondary | ICD-10-CM

## 2018-07-06 MED ORDER — BENZONATATE 100 MG PO CAPS: 100.0000 mg | ORAL_CAPSULE | Freq: Three times a day (TID) | ORAL | 0 refills | Status: DC | PRN

## 2018-07-06 MED ORDER — ALBUTEROL SULFATE HFA 108 (90 BASE) MCG/ACT IN AERS: 1.0000 | INHALATION_SPRAY | Freq: Four times a day (QID) | RESPIRATORY_TRACT | 0 refills | Status: DC | PRN

## 2018-07-06 NOTE — ED Triage Notes (Signed)
Pt presents for persistent cough since December.  Pt states body aches, fatigue since today.

## 2018-07-06 NOTE — ED Provider Notes (Signed)
[facility]    CSN: [id number] Arrival date & time: 07/06/18  1814     History   Chief Complaint Chief Complaint  Patient presents with  . Cough    HPI Samira Vendetta Pittinger is a 58 y.o. female.   58 year old female comes in for 2 day history of URI symptoms. Patient has had 2 other episodes of URI symptoms in the past 3 months, with one last month. However, states these symptoms completely resolved prior to current symptom onset. She has had throat irritation, cough, congestion, rhinorrhea. She has had some chest tightness with feeling of wheezing. Denies chest pain, shortness of breath. Denies fever, chills, night sweats. Has had some hoarseness. No sneezing. Has been taking claritin, flonase with mild relief. Former smoker.      Past Medical History:  Diagnosis Date  . Arthritis   . Depression   . Migraine     Patient Active Problem List   Diagnosis Date Noted  . Eczema 03/05/2018  . Hyperglycemia 03/05/2018  . Internal hemorrhoid 03/18/2017  . IBS (irritable bowel syndrome) 03/18/2017  . Anxiety 07/01/2016  . Depression 07/01/2016  . Allergic rhinitis 07/01/2016  . Migraines 07/01/2016  . Elevated blood pressure reading 07/01/2016  . Obese 07/01/2016    Past Surgical History:  Procedure Laterality Date  . APPENDECTOMY    . TONSILLECTOMY  1986    OB History   No obstetric history on file.      Home Medications    Prior to Admission medications   Medication Sig Start Date End Date Taking? Authorizing Provider  ALPRAZolam Prudy Feeler) 0.5 MG tablet Take 1 tablet (0.5 mg total) by mouth at bedtime. 05/17/18  Yes Burns, Bobette Mo, MD  dextromethorphan-guaiFENesin [facility]: [facility] Rhonda W DM) 30-600 MG 12hr tablet Take 1 tablet by mouth 2 (two) times daily.   Yes [provider]  venlafaxine XR (EFFEXOR-XR) 37.5 MG 24 hr capsule TAKE 1 CAPSULE BY MOUTH EVERY DAY WITH BREAKFAST 03/30/18  Yes Burns, Bobette Mo, MD  albuterol (PROVENTIL HFA;VENTOLIN HFA) 108 (90 Base)  MCG/ACT inhaler Inhale 1-2 puffs into the lungs every 6 (six) hours as needed for wheezing or shortness of breath. 07/06/18   Cathie Hoops, Everlyn Farabaugh V, PA-C  benzonatate (TESSALON) 100 MG capsule Take 1-2 capsules (100-200 mg total) by mouth 3 (three) times daily as needed for cough. 07/06/18   Cathie Hoops, Elvena Oyer V, PA-C  ondansetron (ZOFRAN-ODT) 8 MG disintegrating tablet Take 1 tablet (8 mg total) by mouth every 8 (eight) hours as needed for nausea. 06/15/18   Elvina Sidle, MD    Family History Family History  Problem Relation Age of Onset  . Arthritis Mother   . Diabetes Mother   . Depression Mother   . Arthritis Father   . Heart disease Father   . Hypertension Father   . Arthritis Maternal Grandmother   . Diabetes Maternal Grandfather   . Arthritis Paternal Grandmother   . Depression Brother     Social History Social History   Tobacco Use  . Smoking status: Former Games developer  . Smokeless tobacco: Never Used  Substance Use Topics  . Alcohol use: Yes    Alcohol/week: 7.0 standard drinks    Types: 7 Glasses of wine per week  . Drug use: Yes    Types: Marijuana     Allergies   Patient has no known allergies.   Review of Systems Review of Systems  Reason unable to perform ROS: See HPI as above.     Physical  Exam Triage Vital Signs ED Triage Vitals  Enc Vitals Group     BP --      Pulse Rate 07/06/18 1830 77     Resp 07/06/18 1830 18     Temp 07/06/18 1830 98.2 F (36.8 C)     Temp src --      SpO2 07/06/18 1830 97 %     Weight --      Height --      Head Circumference --      Peak Flow --      Pain Score 07/06/18 1831 6     Pain Loc --      Pain Edu? --      Excl. in GC? --    No data found.  Updated Vital Signs Pulse 77   Temp 98.2 F (36.8 C)   Resp 18   SpO2 97%   Physical Exam Constitutional:      General: She is not in acute distress.    Appearance: She is well-developed. She is not ill-appearing, toxic-appearing or diaphoretic.  HENT:     Head: Normocephalic and  atraumatic.     Right Ear: Tympanic membrane, ear canal and external ear normal. Tympanic membrane is not erythematous or bulging.     Left Ear: Tympanic membrane, ear canal and external ear normal. Tympanic membrane is not erythematous or bulging.     Nose: Nose normal.     Right Sinus: No maxillary sinus tenderness or frontal sinus tenderness.     Left Sinus: No maxillary sinus tenderness or frontal sinus tenderness.     Mouth/Throat:     Mouth: Mucous membranes are moist.     Pharynx: Oropharynx is clear. Uvula midline.  Eyes:     Conjunctiva/sclera: Conjunctivae normal.     Pupils: Pupils are equal, round, and reactive to light.  Neck:     Musculoskeletal: Normal range of motion and neck supple.  Cardiovascular:     Rate and Rhythm: Normal rate and regular rhythm.     Heart sounds: Normal heart sounds. No murmur. No friction rub. No gallop.   Pulmonary:     Effort: Pulmonary effort is normal. No accessory muscle usage, prolonged expiration, respiratory distress or retractions.     Breath sounds: Normal breath sounds. No stridor, decreased air movement or transmitted upper airway sounds. No decreased breath sounds, wheezing, rhonchi or rales.  Skin:    General: Skin is warm and dry.  Neurological:     Mental Status: She is alert and oriented to person, place, and time.      UC Treatments / Results  Labs (all labs ordered are listed, but only abnormal results are displayed) Labs Reviewed - No data to display  EKG None  Radiology No results found.  Procedures Procedures (including critical care time)  Medications Ordered in UC Medications - No data to display  Initial Impression / Assessment and Plan / UC Course  I have reviewed the triage vital signs and the nursing notes.  Pertinent labs & imaging results that were available during my care of the patient were reviewed by me and considered in my medical decision making (see chart for details).    Discussed with  patient history and exam most consistent with viral URI. Symptomatic treatment as needed. Push fluids. Return precautions given.   Final Clinical Impressions(s) / UC Diagnoses   Final diagnoses:  Viral illness   ED Prescriptions    Medication Sig Dispense Auth. Provider  albuterol (PROVENTIL HFA;VENTOLIN HFA) 108 (90 Base) MCG/ACT inhaler Inhale 1-2 puffs into the lungs every 6 (six) hours as needed for wheezing or shortness of breath. 1 Inhaler Mclain Freer V, PA-C   benzonatate (TESSALON) 100 MG capsule Take 1-2 capsules (100-200 mg total) by mouth 3 (three) times daily as needed for cough. 40 capsule Threasa Alpha, [place] 07/06/18 [id number]

## 2018-07-06 NOTE — ED Notes (Signed)
Patient able to ambulate independently  

## 2018-07-06 NOTE — Discharge Instructions (Addendum)
Tessalon for cough. Albuterol inhaler as needed. Start flonase for nasal congestion/drainage. You can use over the counter nasal saline rinse such as neti pot for nasal congestion. Keep hydrated, your urine should be clear to pale yellow in color. Tylenol/motrin for fever and pain. Monitor for any worsening of symptoms, chest pain, shortness of breath, wheezing, swelling of the throat, follow up for reevaluation.   For sore throat/cough try using a honey-based tea. Use 3 teaspoons of honey with juice squeezed from half lemon. Place shaved pieces of ginger into 1/2-1 cup of water and warm over stove top. Then mix the ingredients and repeat every 4 hours as needed.

## 2018-07-08 ENCOUNTER — Ambulatory Visit: Payer: BLUE CROSS/BLUE SHIELD | Admitting: Internal Medicine

## 2018-07-21 ENCOUNTER — Encounter: Payer: Self-pay | Admitting: Internal Medicine

## 2018-07-21 NOTE — Progress Notes (Signed)
Virtual Visit via Video Note  I connected with [name] on 07/21/18 at  8:30 AM EDT by a video enabled telemedicine application and verified that I am speaking with the correct person using two identifiers.   I discussed the limitations of evaluation and management by telemedicine and the availability of in person appointments. The patient expressed understanding and agreed to proceed.  History of Present Illness: She is here for an acute visit for cold symptoms.   Her symptoms started the end of December, but have waxed and waned since then.  She has had a persistent cough since then.  Initially was more productive.  Her mucus was light yellow-light green, but last week it was clear in nature.  She is unsure if some of this was allergies or something more serious.  She does have nasal congestion, postnasal drip and a tickle in the back of her throat and an occasional headache.  The cough is really the most concerning symptom.  She denies any fevers, chills, sinus pain, ear pain, sore throat, chest tightness, shortness of breath, nausea/diarrhea and lightheadedness.  She has tried taking [employer], which do help, cough drops, drinking plenty fluids, Claritin and Flonase.  The cough does affect her sleep.    Observations/Objective: In no acute distress.  Couple episodes of dry cough during the visit.  Assessment and Plan:  See Problem List for Assessment and Plan of chronic medical problems.   Follow Up Instructions:    I discussed the assessment and treatment plan with the patient. The patient was provided an opportunity to ask questions and all were answered. The patient agreed with the plan and demonstrated an understanding of the instructions.   The patient was advised to call back or seek an in-person evaluation if the symptoms worsen or if the condition fails to improve as anticipated.  I provided 10 minutes of non-face-to-face time during this  encounter.   Pincus Sanes, MD

## 2018-07-22 ENCOUNTER — Ambulatory Visit (INDEPENDENT_AMBULATORY_CARE_PROVIDER_SITE_OTHER): Payer: BLUE CROSS/BLUE SHIELD | Admitting: Internal Medicine

## 2018-07-22 ENCOUNTER — Encounter: Payer: Self-pay | Admitting: Internal Medicine

## 2018-07-22 DIAGNOSIS — R05 Cough: Secondary | ICD-10-CM

## 2018-07-22 DIAGNOSIS — R059 Cough, unspecified: Secondary | ICD-10-CM | POA: Insufficient documentation

## 2018-07-22 MED ORDER — BENZONATATE 100 MG PO CAPS: 100.0000 mg | ORAL_CAPSULE | Freq: Three times a day (TID) | ORAL | 0 refills | Status: DC | PRN

## 2018-07-22 MED ORDER — ALBUTEROL SULFATE HFA 108 (90 BASE) MCG/ACT IN AERS: 1.0000 | INHALATION_SPRAY | Freq: Four times a day (QID) | RESPIRATORY_TRACT | 0 refills | Status: DC | PRN

## 2018-07-22 NOTE — Assessment & Plan Note (Signed)
Persisting cough for 3 months Likely multifactorial.  Allergies are likely contributing as well as possibly postinfectious cough Cough is dry or productive of clear sputum and improving No need for an antibiotic at this time Discussed treatment options-deferred cough syrup Continue albuterol inhaler as needed-refilled Continue Tessalon Perles-refilled Continue Claritin, Flonase and drinking plenty fluids  She will let me know via MyChart if her symptoms are not continuing to improve or worsen

## 2018-07-26 MED ORDER — METHYLPREDNISOLONE 4 MG PO TBPK: ORAL_TABLET | ORAL | 0 refills | Status: DC

## 2018-07-26 MED ORDER — AMOXICILLIN-POT CLAVULANATE 875-125 MG PO TABS: 1.0000 | ORAL_TABLET | Freq: Two times a day (BID) | ORAL | 0 refills | Status: DC

## 2018-07-26 NOTE — Addendum Note (Signed)
Addended by: Pincus Sanes on: 07/26/2018 01:03 PM   Modules accepted: Orders

## 2018-08-09 ENCOUNTER — Other Ambulatory Visit: Payer: Self-pay | Admitting: Internal Medicine

## 2018-08-13 ENCOUNTER — Encounter: Payer: Self-pay | Admitting: Internal Medicine

## 2018-08-14 ENCOUNTER — Other Ambulatory Visit: Payer: Self-pay | Admitting: Internal Medicine

## 2018-08-15 ENCOUNTER — Other Ambulatory Visit: Payer: Self-pay | Admitting: Internal Medicine

## 2018-08-30 ENCOUNTER — Ambulatory Visit
Admission: EM | Admit: 2018-08-30 | Discharge: 2018-08-30 | Disposition: A | Discharge status: Home / Self Care | Payer: BLUE CROSS/BLUE SHIELD | Attending: Physician Assistant | Admitting: Physician Assistant

## 2018-08-30 ENCOUNTER — Other Ambulatory Visit: Payer: Self-pay

## 2018-08-30 ENCOUNTER — Encounter: Payer: Self-pay | Admitting: Internal Medicine

## 2018-08-30 ENCOUNTER — Ambulatory Visit (INDEPENDENT_AMBULATORY_CARE_PROVIDER_SITE_OTHER): Payer: BLUE CROSS/BLUE SHIELD

## 2018-08-30 DIAGNOSIS — S8992XA Unspecified injury of left lower leg, initial encounter: Secondary | ICD-10-CM | POA: Diagnosis not present

## 2018-08-30 DIAGNOSIS — M1712 Unilateral primary osteoarthritis, left knee: Secondary | ICD-10-CM | POA: Diagnosis not present

## 2018-08-30 NOTE — Discharge Instructions (Addendum)
See the Orthopaedist for evaluation.  Ibuprofen for pain

## 2018-08-30 NOTE — ED Provider Notes (Addendum)
[facility]    CSN: [id number] Arrival date & time: 08/30/18  1649     History   Chief Complaint Chief Complaint  Patient presents with  . Knee Pain    HPI Andrea Oneal is a 58 y.o. female.   The history is provided by the patient. No language interpreter was used.  Knee Pain  Location:  Knee Time since incident:  1 hour Knee location:  L knee Pain details:    Quality:  Aching   Radiates to:  Does not radiate   Severity:  Moderate   Onset quality:  Gradual   Timing:  Constant   Progression:  Worsening Chronicity:  New Dislocation: no   Prior injury to area:  No Worsened by:  Nothing Ineffective treatments:  None tried Risk factors: no concern for non-accidental trauma   Pt reports she had sudden onset of pain in the back of her left knee.  Pt heard a loud pop.   Past Medical History:  Diagnosis Date  . Arthritis   . Depression   . Migraine     Patient Active Problem List   Diagnosis Date Noted  . Cough 07/22/2018  . Eczema 03/05/2018  . Hyperglycemia 03/05/2018  . Internal hemorrhoid 03/18/2017  . IBS (irritable bowel syndrome) 03/18/2017  . Anxiety 07/01/2016  . Depression 07/01/2016  . Allergic rhinitis 07/01/2016  . Migraines 07/01/2016  . Elevated blood pressure reading 07/01/2016  . Obese 07/01/2016    Past Surgical History:  Procedure Laterality Date  . APPENDECTOMY    . TONSILLECTOMY  1986    OB History   No obstetric history on file.      Home Medications    Prior to Admission medications   Medication Sig Start Date End Date Taking? Authorizing Provider  albuterol (VENTOLIN HFA) 108 (90 Base) MCG/ACT inhaler INHALE 1-2 PUFFS INTO THE LUNGS EVERY 6 (SIX) HOURS AS NEEDED FOR WHEEZING OR SHORTNESS OF BREATH. 08/16/18   Burns, Bobette MoStacy J, MD  ALPRAZolam Prudy Feeler(XANAX) 0.5 MG tablet Take 1 tablet (0.5 mg total) by mouth at bedtime. 05/17/18   Pincus SanesBurns, Stacy J, MD  benzonatate (TESSALON) 100 MG capsule Take 1-2 capsules (100-200 mg  total) by mouth 3 (three) times daily as needed for cough. 07/22/18   Pincus SanesBurns, Stacy J, MD  dextromethorphan-guaiFENesin (MUCINEX DM) 30-600 MG 12hr tablet Take 1 tablet by mouth 2 (two) times daily.    [provider]  methylPREDNISolone (MEDROL DOSEPAK) 4 MG TBPK tablet 24 mg PO on day 1, then decr. by 4 mg/day x5 days 07/26/18   Pincus SanesBurns, Stacy J, MD  ondansetron (ZOFRAN-ODT) 8 MG disintegrating tablet Take 1 tablet (8 mg total) by mouth every 8 (eight) hours as needed for nausea. 06/15/18   Elvina SidleLauenstein, Kurt, MD  venlafaxine XR (EFFEXOR-XR) 37.5 MG 24 hr capsule TAKE 1 CAPSULE BY MOUTH EVERY DAY WITH BREAKFAST 08/09/18   Pincus SanesBurns, Stacy J, MD    Family History Family History  Problem Relation Age of Onset  . Arthritis Mother   . Diabetes Mother   . Depression Mother   . Arthritis Father   . Heart disease Father   . Hypertension Father   . Arthritis Maternal Grandmother   . Diabetes Maternal Grandfather   . Arthritis Paternal Grandmother   . Depression Brother     Social History Social History   Tobacco Use  . Smoking status: Former Games developermoker  . Smokeless tobacco: Never Used  Substance Use Topics  . Alcohol use: Yes  Alcohol/week: 7.0 standard drinks    Types: 7 Glasses of wine per week  . Drug use: Yes    Types: Marijuana     Allergies   Patient has no known allergies.   Review of Systems Review of Systems  All other systems reviewed and are negative.    Physical Exam Triage Vital Signs ED Triage Vitals [08/30/18 1659]  Enc Vitals Group     BP (!) 191/95     Pulse Rate 71     Resp 18     Temp 97.6 F (36.4 C)     Temp Source Oral     SpO2 96 %     Weight      Height      Head Circumference      Peak Flow      Pain Score 7     Pain Loc      Pain Edu?      Excl. in GC?    No data found.  Updated Vital Signs BP (!) 191/95 (BP Location: Left Arm)   Pulse 71   Temp 97.6 F (36.4 C) (Oral)   Resp 18   SpO2 96%   Visual Acuity Right Eye Distance:    Left Eye Distance:   Bilateral Distance:    Right Eye Near:   Left Eye Near:    Bilateral Near:     Physical Exam Vitals signs reviewed.  Cardiovascular:     Rate and Rhythm: Normal rate.  Pulmonary:     Effort: Pulmonary effort is normal.  Musculoskeletal:        General: Swelling and tenderness present.  Skin:    General: Skin is warm.  Neurological:     General: No focal deficit present.     Mental Status: She is alert.  Psychiatric:        Mood and Affect: Mood normal.      UC Treatments / Results  Labs (all labs ordered are listed, but only abnormal results are displayed) Labs Reviewed - No data to display  EKG None  Radiology Dg Knee Complete 4 Views Left  Result Date: 08/30/2018 CLINICAL DATA:  Posterior knee pain following walking, initial encounter EXAM: LEFT KNEE - COMPLETE 4+ VIEW COMPARISON:  None. FINDINGS: No acute fracture or dislocation is noted. Mild degenerative changes are noted. No soft tissue abnormality is seen. IMPRESSION: No acute abnormality noted. Electronically Signed   By: Alcide Clever M.D.   On: 08/30/2018 17:25    Procedures Procedures (including critical care time)  Medications Ordered in UC Medications - No data to display  Initial Impression / Assessment and Plan / UC Course  I have reviewed the triage vital signs and the nursing notes.  Pertinent labs & imaging results that were available during my care of the patient were reviewed by me and considered in my medical decision making (see chart for details).     MDM  No instability,  Pt placed in an a knee imbolizer.  Pt advised to follow up with Orthopaedist for recheck.  Final Clinical Impressions(s) / UC Diagnoses   Final diagnoses:  Knee injury, left, initial encounter   Discharge Instructions   None    ED Prescriptions    None     Controlled Substance Prescriptions [place] Controlled Substance Registry consulted? Not Applicable   Osie Cheeks 08/30/18  1737    Elson Areas, PA-C 09/28/18 1447

## 2018-08-30 NOTE — ED Triage Notes (Signed)
Pt c/o lt knee pain, heard and felt a pop and fell to the ground. Denies injuries

## 2018-09-03 DIAGNOSIS — M25562 Pain in left knee: Secondary | ICD-10-CM | POA: Diagnosis not present

## 2018-09-05 NOTE — Progress Notes (Signed)
Virtual Visit via Video Note  I connected with Andrea Oneal on 09/06/18 at  9:00 AM EDT by a video enabled telemedicine application and verified that I am speaking with the correct person using two identifiers.   I discussed the limitations of evaluation and management by telemedicine and the availability of in person appointments. The patient expressed understanding and agreed to proceed.  The patient is currently at home and I am in the office.    No referring provider.    History of Present Illness: She is here for follow up of depression and anxiety  Depression: She is taking her medication daily as prescribed. She denies any side effects from the medication. She feels her depression is well controlled and she is happy with her current dose of medication.   Anxiety: She is taking her effexor daily as prescribed. She takes xanax as needed - maybe two times a week.  She denies any side effects from the medication. She feels her anxiety is well controlled and she is happy with her current dose of medication.  She does find herself more weepy, but she is ok with that because her anxiety is better.    Review of Systems  Constitutional:       Appetite is normal.  Respiratory: Negative for sputum production.   Cardiovascular: Negative for chest pain and palpitations.  Gastrointestinal: Negative for constipation, diarrhea and nausea.  Neurological: Positive for dizziness (occ with allergies).  Psychiatric/Behavioral: Positive for depression (controlled). The patient is nervous/anxious (controlled).      Social History   Socioeconomic History  . Marital status: Divorced    Spouse name: Not on file  . Number of children: Not on file  . Years of education: Not on file  . Highest education level: Not on file  Occupational History  . Not on file  Social Needs  . Financial resource strain: Not on file  . Food insecurity:    Worry: Not on file    Inability: Not on file  .  Transportation needs:    Medical: Not on file    Non-medical: Not on file  Tobacco Use  . Smoking status: Former Games developer  . Smokeless tobacco: Never Used  Substance and Sexual Activity  . Alcohol use: Yes    Alcohol/week: 7.0 standard drinks    Types: 7 Glasses of wine per week  . Drug use: Yes    Types: Marijuana  . Sexual activity: Not on file  Lifestyle  . Physical activity:    Days per week: Not on file    Minutes per session: Not on file  . Stress: Not on file  Relationships  . Social connections:    Talks on phone: Not on file    Gets together: Not on file    Attends religious service: Not on file    Active member of club or organization: Not on file    Attends meetings of clubs or organizations: Not on file    Relationship status: Not on file  Other Topics Concern  . Not on file  Social History Narrative   Has boyfriend, his daughter just moved in -16     Observations/Objective: Appears well in NAD Normal mood, affect and thought process  Assessment and Plan:  See Problem List for Assessment and Plan of chronic medical problems.   Follow Up Instructions:    I discussed the assessment and treatment plan with the patient. The patient was provided an opportunity to ask questions and  all were answered. The patient agreed with the plan and demonstrated an understanding of the instructions.   The patient was advised to call back or seek an in-person evaluation if the symptoms worsen or if the condition fails to improve as anticipated.  FU in 6 months- CPE  Pincus SanesStacy J Baleigh Rennaker, MD

## 2018-09-06 ENCOUNTER — Ambulatory Visit (INDEPENDENT_AMBULATORY_CARE_PROVIDER_SITE_OTHER): Payer: BLUE CROSS/BLUE SHIELD | Admitting: Internal Medicine

## 2018-09-06 ENCOUNTER — Encounter: Payer: Self-pay | Admitting: Internal Medicine

## 2018-09-06 DIAGNOSIS — F419 Anxiety disorder, unspecified: Secondary | ICD-10-CM

## 2018-09-06 DIAGNOSIS — F3289 Other specified depressive episodes: Secondary | ICD-10-CM

## 2018-09-06 MED ORDER — ALPRAZOLAM 0.5 MG PO TABS: 0.5000 mg | ORAL_TABLET | Freq: Every day | ORAL | 0 refills | Status: DC

## 2018-09-06 NOTE — Assessment & Plan Note (Signed)
Controlled, stable Continue current dose of medication FU in 6 mo

## 2018-09-06 NOTE — Assessment & Plan Note (Signed)
Improved with effexor and controlled Continue current dose of effexor Xanax prn only - taking it about 2/week F/u at CPE in 6 months

## 2018-09-09 ENCOUNTER — Encounter: Payer: Self-pay | Admitting: Internal Medicine

## 2018-09-17 DIAGNOSIS — M25562 Pain in left knee: Secondary | ICD-10-CM | POA: Diagnosis not present

## 2018-09-17 DIAGNOSIS — M76892 Other specified enthesopathies of left lower limb, excluding foot: Secondary | ICD-10-CM | POA: Diagnosis not present

## 2018-09-17 DIAGNOSIS — M76899 Other specified enthesopathies of unspecified lower limb, excluding foot: Secondary | ICD-10-CM | POA: Insufficient documentation

## 2018-09-17 DIAGNOSIS — S76312A Strain of muscle, fascia and tendon of the posterior muscle group at thigh level, left thigh, initial encounter: Secondary | ICD-10-CM | POA: Diagnosis not present

## 2018-09-18 ENCOUNTER — Other Ambulatory Visit: Payer: Self-pay | Admitting: Internal Medicine

## 2018-09-27 DIAGNOSIS — M76899 Other specified enthesopathies of unspecified lower limb, excluding foot: Secondary | ICD-10-CM | POA: Diagnosis not present

## 2018-10-08 ENCOUNTER — Other Ambulatory Visit: Payer: Self-pay | Admitting: Internal Medicine

## 2018-10-18 ENCOUNTER — Other Ambulatory Visit: Payer: Self-pay | Admitting: Internal Medicine

## 2018-10-19 MED ORDER — ALPRAZOLAM 0.5 MG PO TABS: 0.5000 mg | ORAL_TABLET | Freq: Every day | ORAL | 0 refills | Status: DC

## 2018-10-19 NOTE — Telephone Encounter (Signed)
[place] Controlled Database Checked Last filled: 09/06/18 # 30 LOV w/you: 09/06/18 Next appt w/you: 03/07/19

## 2018-10-28 ENCOUNTER — Encounter: Payer: Self-pay | Admitting: Internal Medicine

## 2019-02-04 ENCOUNTER — Other Ambulatory Visit: Payer: Self-pay | Admitting: Internal Medicine

## 2019-02-04 ENCOUNTER — Other Ambulatory Visit: Payer: Self-pay

## 2019-02-04 ENCOUNTER — Encounter: Payer: Self-pay | Admitting: Internal Medicine

## 2019-02-04 MED ORDER — VENLAFAXINE HCL ER 37.5 MG PO CP24: ORAL_CAPSULE | ORAL | 0 refills | Status: DC

## 2019-02-06 ENCOUNTER — Other Ambulatory Visit: Payer: Self-pay | Admitting: Internal Medicine

## 2019-02-08 ENCOUNTER — Other Ambulatory Visit: Payer: Self-pay

## 2019-02-08 MED ORDER — VENLAFAXINE HCL ER 37.5 MG PO CP24: ORAL_CAPSULE | ORAL | 0 refills | Status: DC

## 2019-02-19 ENCOUNTER — Ambulatory Visit (INDEPENDENT_AMBULATORY_CARE_PROVIDER_SITE_OTHER)
Admission: RE | Admit: 2019-02-19 | Discharge: 2019-02-19 | Disposition: A | Discharge status: Home / Self Care | Payer: BC Managed Care – PPO | Source: Ambulatory Visit

## 2019-02-19 DIAGNOSIS — J3489 Other specified disorders of nose and nasal sinuses: Secondary | ICD-10-CM | POA: Diagnosis not present

## 2019-02-19 DIAGNOSIS — R0981 Nasal congestion: Secondary | ICD-10-CM

## 2019-02-19 DIAGNOSIS — R059 Cough, unspecified: Secondary | ICD-10-CM

## 2019-02-19 DIAGNOSIS — R519 Headache, unspecified: Secondary | ICD-10-CM

## 2019-02-19 DIAGNOSIS — B349 Viral infection, unspecified: Secondary | ICD-10-CM

## 2019-02-19 DIAGNOSIS — R05 Cough: Secondary | ICD-10-CM | POA: Diagnosis not present

## 2019-02-19 DIAGNOSIS — J029 Acute pharyngitis, unspecified: Secondary | ICD-10-CM

## 2019-02-19 DIAGNOSIS — R197 Diarrhea, unspecified: Secondary | ICD-10-CM

## 2019-02-19 DIAGNOSIS — Z9109 Other allergy status, other than to drugs and biological substances: Secondary | ICD-10-CM

## 2019-02-19 MED ORDER — PROMETHAZINE-DM 6.25-15 MG/5ML PO SYRP: 5.0000 mL | ORAL_SOLUTION | Freq: Three times a day (TID) | ORAL | 0 refills | Status: DC | PRN

## 2019-02-19 NOTE — ED Provider Notes (Signed)
Virtual Visit via Video Note:  Laron Angelini  initiated request for Telemedicine visit with [facility] team. I connected with [name]  on 02/19/2019 at 12:22 PM  for a synchronized telemedicine visit using a video enabled HIPPA compliant telemedicine application. I verified that I am speaking with [name]  using two identifiers. Wallis Bamberg, PA-C  was physically located in a [facility]gent care site and Janeth Terry was located at a different location.   The limitations of evaluation and management by telemedicine as well as the availability of in-person appointments were discussed. Patient was informed that she  may incur a bill ( including co-pay) for this virtual visit encounter. [place] Letia Guidry  expressed understanding and gave verbal consent to proceed with virtual visit.     History of Present Illness:Andrea Oneal  is a 58 y.o. female presents with 3 day history of acute onset mild-moderate malaise. ROS below. Had a flu shot ~1 month ago. Has a hx of allergies, takes Flonase, Claritin daily. Has not had COVID 19 contacts.   Review of Systems  Constitutional: Negative for fever and malaise/fatigue.  HENT: Positive for congestion and sore throat. Negative for ear pain and sinus pain.   Eyes: Negative for blurred vision, double vision, discharge and redness.  Respiratory: Positive for cough. Negative for hemoptysis, shortness of breath and wheezing.   Cardiovascular: Negative for chest pain.  Gastrointestinal: Positive for diarrhea (a few loose stools). Negative for abdominal pain, blood in stool, constipation, nausea and vomiting.  Genitourinary: Negative for dysuria, flank pain and hematuria.  Musculoskeletal: Negative for myalgias.  Skin: Negative for rash.  Neurological: Positive for headaches. Negative for dizziness and weakness.  Psychiatric/Behavioral: Negative for depression and substance abuse.    No current  facility-administered medications for this encounter.    Current Outpatient Medications  Medication Sig Dispense Refill  . albuterol (VENTOLIN HFA) 108 (90 Base) MCG/ACT inhaler INHALE 1-2 PUFFS INTO THE LUNGS EVERY 6 (SIX) HOURS AS NEEDED FOR WHEEZING OR SHORTNESS OF BREATH. 8.5 Inhaler 2  . ALPRAZolam (XANAX) 0.5 MG tablet Take 1 tablet (0.5 mg total) by mouth at bedtime. 30 tablet 0  . dextromethorphan-guaiFENesin (MUCINEX DM) 30-600 MG 12hr tablet Take 1 tablet by mouth 2 (two) times daily.    . ondansetron (ZOFRAN-ODT) 8 MG disintegrating tablet Take 1 tablet (8 mg total) by mouth every 8 (eight) hours as needed for nausea. 12 tablet 0  . venlafaxine XR (EFFEXOR-XR) 37.5 MG 24 hr capsule TAKE 1 CAPSULE BY MOUTH EVERY DAY WITH BREAKFAST 90 capsule 0     No Known Allergies    Past Medical History:  Diagnosis Date  . Arthritis   . Depression   . Migraine     Past Surgical History:  Procedure Laterality Date  . APPENDECTOMY    . TONSILLECTOMY  1986      Observations/Objective: Physical Exam Constitutional:      General: She is not in acute distress.    Appearance: Normal appearance. She is well-developed. She is not ill-appearing, toxic-appearing or diaphoretic.  Eyes:     Extraocular Movements: Extraocular movements intact.  Pulmonary:     Effort: Pulmonary effort is normal.  Neurological:     General: No focal deficit present.     Mental Status: She is alert and oriented to person, place, and time.  Psychiatric:        Mood and Affect: Mood normal.  Behavior: Behavior normal.        Thought Content: Thought content normal.        Judgment: Judgment normal.     Assessment and Plan:  1. Viral syndrome   2. Cough   3. Stuffy and runny nose   4. Environmental allergies     Patient is very well appearing. Counseled on COVID 19 but suspect viral URI versus allergies more likely. Offered sx relief, discussed supportive care. Provided patient with info for  testing sites for COVID 19. Counseled patient on potential for adverse effects with medications prescribed/recommended today, ER and return-to-clinic precautions discussed, patient verbalized understanding.   Follow Up Instructions:    I discussed the assessment and treatment plan with the patient. The patient was provided an opportunity to ask questions and all were answered. The patient agreed with the plan and demonstrated an understanding of the instructions.   The patient was advised to call back or seek an in-person evaluation if the symptoms worsen or if the condition fails to improve as anticipated.  I provided 15 minutes of non-face-to-face time during this encounter.    Jaynee Eagles, PA-C  02/19/2019 12:22 PM         Jaynee Eagles, PA-C 02/19/19 1232

## 2019-02-19 NOTE — Discharge Instructions (Addendum)
[place]: [facility] (Visitor Entrance), [address] [place], [place]: [address], [place], [place], [place] (entrance off [address])  [place] (Closed each Monday): [address], [place], [place] - the short stay covered [address] at [facility] (Use the [employer] entrance to [facility] next to [facility].)  We will manage this as a viral syndrome. For sore throat or cough try using a honey-based tea. Use 3 teaspoons of honey with juice squeezed from half lemon. Place shaved pieces of ginger into 1/2-1 cup of water and warm over stove top. Then mix the ingredients and repeat every 4 hours as needed. Please take Tylenol 500mg  every 6 hours. Hydrate very well with at least 2 liters of water. Eat light meals such as soups to replenish electrolytes and soft fruits, veggies. Start an antihistamine like Zyrtec, Allegra or Claritin for postnasal drainage, sinus congestion.  You can take this together with pseudoephedrine (Sudafed) at a dose of 60 mg 3 times a day or 120 mg twice daily as needed for the same kind of congestion.

## 2019-02-23 ENCOUNTER — Encounter: Payer: Self-pay | Admitting: Internal Medicine

## 2019-03-07 ENCOUNTER — Encounter: Payer: BLUE CROSS/BLUE SHIELD | Admitting: Internal Medicine

## 2019-03-18 ENCOUNTER — Other Ambulatory Visit: Payer: Self-pay

## 2019-03-18 DIAGNOSIS — Z20822 Contact with and (suspected) exposure to covid-19: Secondary | ICD-10-CM

## 2019-03-21 LAB — NOVEL CORONAVIRUS, NAA: SARS-CoV-2, NAA: NOT DETECTED

## 2019-03-23 ENCOUNTER — Encounter: Payer: BLUE CROSS/BLUE SHIELD | Admitting: Internal Medicine

## 2019-03-31 NOTE — Progress Notes (Signed)
Subjective:    Patient ID: Andrea Oneal, female    DOB: 1960/08/13, 58 y.o.   MRN: [id number]  HPI She is here for a physical exam.   Her anxiety level is high.  She is taking her medication and it is helping.   She feels her medication is controlled her anxiety enough and no changes are needed.   She injured her leg and was not able to exercise for a while, but has just started.  She likes exercising and will increase it.  It is helpful for her anxiety.    She knows she needs to lose weight.  She knows she needs to decrease her alcohol intake.    Medications and allergies reviewed with patient and updated if appropriate.  Patient Active Problem List   Diagnosis Date Noted  . Hypertension 04/01/2019  . Biceps femoris tendinitis 09/17/2018  . Eczema 03/05/2018  . Hyperglycemia 03/05/2018  . Internal hemorrhoid 03/18/2017  . IBS (irritable bowel syndrome) 03/18/2017  . Anxiety 07/01/2016  . Depression 07/01/2016  . Allergic rhinitis 07/01/2016  . Migraines 07/01/2016  . Obese 07/01/2016    No current outpatient medications on file prior to visit.   No current facility-administered medications on file prior to visit.     Past Medical History:  Diagnosis Date  . Arthritis   . Depression   . Migraine     Past Surgical History:  Procedure Laterality Date  . APPENDECTOMY    . TONSILLECTOMY  1986    Social History   Socioeconomic History  . Marital status: Divorced    Spouse name: Not on file  . Number of children: Not on file  . Years of education: Not on file  . Highest education level: Not on file  Occupational History  . Not on file  Social Needs  . Financial resource strain: Not on file  . Food insecurity    Worry: Not on file    Inability: Not on file  . Transportation needs    Medical: Not on file    Non-medical: Not on file  Tobacco Use  . Smoking status: Former Research scientist (life sciences)  . Smokeless tobacco: Never Used  Substance and Sexual Activity  .  Alcohol use: Yes    Alcohol/week: 7.0 standard drinks    Types: 7 Glasses of wine per week  . Drug use: Yes    Types: Marijuana  . Sexual activity: Not on file  Lifestyle  . Physical activity    Days per week: Not on file    Minutes per session: Not on file  . Stress: Not on file  Relationships  . Social Herbalist on phone: Not on file    Gets together: Not on file    Attends religious service: Not on file    Active member of club or organization: Not on file    Attends meetings of clubs or organizations: Not on file    Relationship status: Not on file  Other Topics Concern  . Not on file  Social History Narrative   Has boyfriend, his daughter just moved in -67    Family History  Problem Relation Age of Onset  . Arthritis Mother   . Diabetes Mother   . Depression Mother   . Arthritis Father   . Heart disease Father   . Hypertension Father   . Arthritis Maternal Grandmother   . Diabetes Maternal Grandfather   . Arthritis Paternal Grandmother   . Depression Brother  Review of Systems  Constitutional: Negative for chills and fever.  Eyes: Negative for visual disturbance.  Respiratory: Negative for cough, shortness of breath and wheezing.   Cardiovascular: Negative for chest pain, palpitations and leg swelling.  Gastrointestinal: Positive for anal bleeding (hemorrhoidal). Negative for abdominal pain, blood in stool, constipation, diarrhea and nausea.       Occ GERD  Genitourinary: Negative for dysuria and hematuria.  Musculoskeletal: Positive for arthralgias (mild OA in fingers).  Skin: Positive for rash (eczema). Negative for color change.  Neurological: Negative for light-headedness, numbness and headaches.  Psychiatric/Behavioral: Positive for dysphoric mood. The patient is nervous/anxious.        Objective:   Vitals:   04/01/19 0757  BP: (!) 156/94  Pulse: 69  Resp: 16  Temp: 98.5 F (36.9 C)  SpO2: 98%   Filed Weights   04/01/19 0757   Weight: 199 lb (90.3 kg)   Body mass index is 33.12 kg/m.  BP Readings from Last 3 Encounters:  04/01/19 (!) 156/94  08/30/18 (!) 191/95  06/15/18 (!) 155/94    Wt Readings from Last 3 Encounters:  04/01/19 199 lb (90.3 kg)  03/05/18 200 lb (90.7 kg)  03/18/17 200 lb (90.7 kg)     Physical Exam Constitutional: She appears well-developed and well-nourished. No distress.  HENT:  Head: Normocephalic and atraumatic.  Right Ear: External ear normal. Normal ear canal and TM Left Ear: External ear normal.  Normal ear canal and TM Mouth/Throat: Oropharynx is clear and moist.  Eyes: Conjunctivae and EOM are normal.  Neck: Neck supple. No tracheal deviation present. No thyromegaly present.  No carotid bruit  Cardiovascular: Normal rate, regular rhythm and normal heart sounds.  No murmur heard.  No edema. Pulmonary/Chest: Effort normal and breath sounds normal. No respiratory distress. She has no wheezes. She has no rales.  Breast: deferred   Abdominal: Soft. She exhibits no distension. There is no tenderness.  Lymphadenopathy: She has no cervical adenopathy.  Skin: Skin is warm and dry. She is not diaphoretic. Ganglion cyst right anterior wrist, scratches on arms from dog - noninfected Psychiatric: She has a normal mood and affect. Her behavior is normal.        Assessment & Plan:   Physical exam: Screening blood work    ordered Immunizations  tdap today, discussed shingrix Colonoscopy up-to-date - 2014 - due after 10 years - 2024 Mammogram    due now - will have Jan Gyn  Up to date - scheduled for Jan Eye exams  Up to date  Exercise  Walking dog 5 times a week, the other day walked on treadmill Weight encouraged weight loss Substance abuse   2 glasses of wine nightly   See Problem List for Assessment and Plan of chronic medical problems.    This visit occurred during the SARS-CoV-2 public health emergency.  Safety protocols were in place, including screening questions  prior to the visit, additional usage of staff PPE, and extensive cleaning of exam room while observing appropriate contact time as indicated for disinfecting solutions.    FU in one month

## 2019-03-31 NOTE — Patient Instructions (Addendum)
Tests ordered today. Your results will be released to [place] (or called to you) after review.  If any changes need to be made, you will be notified at that same time.  All other Health Maintenance issues reviewed.   All recommended immunizations and age-appropriate screenings are up-to-date or discussed.  Tetanus immunization administered today.   Medications reviewed and updated.  Changes include :   Start losartan-hctz daily for your BP  Your prescription(s) have been submitted to your pharmacy. Please take as directed and contact our office if you believe you are having problem(s) with the medication(s).   Please followup in 1 month    Health Maintenance, Female Adopting a healthy lifestyle and getting preventive care are important in promoting health and wellness. Ask your health care provider about:  The right schedule for you to have regular tests and exams.  Things you can do on your own to prevent diseases and keep yourself healthy. What should I know about diet, weight, and exercise? Eat a healthy diet   Eat a diet that includes plenty of vegetables, fruits, low-fat dairy products, and lean protein.  Do not eat a lot of foods that are high in solid fats, added sugars, or sodium. Maintain a healthy weight Body mass index (BMI) is used to identify weight problems. It estimates body fat based on height and weight. Your health care provider can help determine your BMI and help you achieve or maintain a healthy weight. Get regular exercise Get regular exercise. This is one of the most important things you can do for your health. Most adults should:  Exercise for at least 150 minutes each week. The exercise should increase your heart rate and make you sweat (moderate-intensity exercise).  Do strengthening exercises at least twice a week. This is in addition to the moderate-intensity exercise.  Spend less time sitting. Even light physical activity can be beneficial. Watch  cholesterol and blood lipids Have your blood tested for lipids and cholesterol at 57 years of age, then have this test every 5 years. Have your cholesterol levels checked more often if:  Your lipid or cholesterol levels are high.  You are older than 57 years of age.  You are at high risk for heart disease. What should I know about cancer screening? Depending on your health history and family history, you may need to have cancer screening at various ages. This may include screening for:  Breast cancer.  Cervical cancer.  Colorectal cancer.  Skin cancer.  Lung cancer. What should I know about heart disease, diabetes, and high blood pressure? Blood pressure and heart disease  High blood pressure causes heart disease and increases the risk of stroke. This is more likely to develop in people who have high blood pressure readings, are of African descent, or are overweight.  Have your blood pressure checked: ? Every 3-5 years if you are 68-59 years of age. ? Every year if you are 41 years old or older. Diabetes Have regular diabetes screenings. This checks your fasting blood sugar level. Have the screening done:  Once every three years after age 29 if you are at a normal weight and have a low risk for diabetes.  More often and at a younger age if you are overweight or have a high risk for diabetes. What should I know about preventing infection? Hepatitis B If you have a higher risk for hepatitis B, you should be screened for this virus. Talk with your health care provider to find out  if you are at risk for hepatitis B infection. Hepatitis C Testing is recommended for:  Everyone born from [birth year] through 1964.  Anyone with known risk factors for hepatitis C. Sexually transmitted infections (STIs)  Get screened for STIs, including gonorrhea and chlamydia, if: ? You are sexually active and are younger than 57 years of age. ? You are older than 57 years of age and your health care  provider tells you that you are at risk for this type of infection. ? Your sexual activity has changed since you were last screened, and you are at increased risk for chlamydia or gonorrhea. Ask your health care provider if you are at risk.  Ask your health care provider about whether you are at high risk for HIV. Your health care provider may recommend a prescription medicine to help prevent HIV infection. If you choose to take medicine to prevent HIV, you should first get tested for HIV. You should then be tested every 3 months for as long as you are taking the medicine. Pregnancy  If you are about to stop having your period (premenopausal) and you may become pregnant, seek counseling before you get pregnant.  Take 400 to 800 micrograms (mcg) of folic acid every day if you become pregnant.  Ask for birth control (contraception) if you want to prevent pregnancy. Osteoporosis and menopause Osteoporosis is a disease in which the bones lose minerals and strength with aging. This can result in bone fractures. If you are 64 years old or older, or if you are at risk for osteoporosis and fractures, ask your health care provider if you should:  Be screened for bone loss.  Take a calcium or vitamin D supplement to lower your risk of fractures.  Be given hormone replacement therapy (HRT) to treat symptoms of menopause. Follow these instructions at home: Lifestyle  Do not use any products that contain nicotine or tobacco, such as cigarettes, e-cigarettes, and chewing tobacco. If you need help quitting, ask your health care provider.  Do not use street drugs.  Do not share needles.  Ask your health care provider for help if you need support or information about quitting drugs. Alcohol use  Do not drink alcohol if: ? Your health care provider tells you not to drink. ? You are pregnant, may be pregnant, or are planning to become pregnant.  If you drink alcohol: ? Limit how much you use to 0-1  drink a day. ? Limit intake if you are breastfeeding.  Be aware of how much alcohol is in your drink. In the [place] one drink equals one 12 oz bottle of beer (355 mL), one 5 oz glass of wine (148 mL), or one 1 oz glass of hard liquor (44 mL). General instructions  Schedule regular health, dental, and eye exams.  Stay current with your vaccines.  Tell your health care provider if: ? You often feel depressed. ? You have ever been abused or do not feel safe at home. Summary  Adopting a healthy lifestyle and getting preventive care are important in promoting health and wellness.  Follow your health care provider's instructions about healthy diet, exercising, and getting tested or screened for diseases.  Follow your health care provider's instructions on monitoring your cholesterol and blood pressure. This information is not intended to replace advice given to you by your health care provider. Make sure you discuss any questions you have with your health care provider. Document Released: 10/28/2010 Document Revised: 04/07/2018 Document Reviewed: 04/07/2018 Elsevier Patient  Education  2020 [employer]  

## 2019-04-01 ENCOUNTER — Encounter: Payer: Self-pay | Admitting: Internal Medicine

## 2019-04-01 ENCOUNTER — Ambulatory Visit (INDEPENDENT_AMBULATORY_CARE_PROVIDER_SITE_OTHER): Payer: BC Managed Care – PPO | Admitting: Internal Medicine

## 2019-04-01 ENCOUNTER — Other Ambulatory Visit (INDEPENDENT_AMBULATORY_CARE_PROVIDER_SITE_OTHER): Payer: BC Managed Care – PPO

## 2019-04-01 ENCOUNTER — Other Ambulatory Visit: Payer: Self-pay

## 2019-04-01 VITALS — BP 156/94 | HR 69 | Temp 98.5°F | Resp 16 | Ht 65.0 in | Wt 199.0 lb

## 2019-04-01 DIAGNOSIS — R739 Hyperglycemia, unspecified: Secondary | ICD-10-CM | POA: Diagnosis not present

## 2019-04-01 DIAGNOSIS — Z0000 Encounter for general adult medical examination without abnormal findings: Secondary | ICD-10-CM

## 2019-04-01 DIAGNOSIS — Z23 Encounter for immunization: Secondary | ICD-10-CM

## 2019-04-01 DIAGNOSIS — I10 Essential (primary) hypertension: Secondary | ICD-10-CM | POA: Diagnosis not present

## 2019-04-01 DIAGNOSIS — F3289 Other specified depressive episodes: Secondary | ICD-10-CM

## 2019-04-01 DIAGNOSIS — E6609 Other obesity due to excess calories: Secondary | ICD-10-CM

## 2019-04-01 DIAGNOSIS — Z6833 Body mass index (BMI) 33.0-33.9, adult: Secondary | ICD-10-CM

## 2019-04-01 DIAGNOSIS — F419 Anxiety disorder, unspecified: Secondary | ICD-10-CM

## 2019-04-01 DIAGNOSIS — L309 Dermatitis, unspecified: Secondary | ICD-10-CM

## 2019-04-01 LAB — LIPID PANEL
Cholesterol: 194 mg/dL (ref 0–200)
HDL: 40.8 mg/dL (ref >39.00)
LDL Cholesterol: 117 mg/dL — ABNORMAL HIGH (ref 0–99)
NonHDL: 153.1
Total CHOL/HDL Ratio: 5
Triglycerides: 183 mg/dL — ABNORMAL HIGH (ref 0.0–149.0)
VLDL: 36.6 mg/dL (ref 0.0–40.0)

## 2019-04-01 LAB — HEMOGLOBIN A1C: Hgb A1c MFr Bld: 5.5 % (ref 4.6–6.5)

## 2019-04-01 LAB — CBC WITH DIFFERENTIAL/PLATELET
Basophils Absolute: 0.1 10*3/uL (ref 0.0–0.1)
Basophils Relative: 1.5 % (ref 0.0–3.0)
Eosinophils Absolute: 0.4 10*3/uL (ref 0.0–0.7)
Eosinophils Relative: 4.8 % (ref 0.0–5.0)
HCT: 40.7 % (ref 36.0–46.0)
Hemoglobin: 13.6 g/dL (ref 12.0–15.0)
Lymphocytes Relative: 36.7 % (ref 12.0–46.0)
Lymphs Abs: 3.3 10*3/uL (ref 0.7–4.0)
MCHC: 33.3 g/dL (ref 30.0–36.0)
MCV: 89.5 fl (ref 78.0–100.0)
Monocytes Absolute: 0.6 10*3/uL (ref 0.1–1.0)
Monocytes Relative: 6.5 % (ref 3.0–12.0)
Neutro Abs: 4.5 10*3/uL (ref 1.4–7.7)
Neutrophils Relative %: 50.5 % (ref 43.0–77.0)
Platelets: 281 10*3/uL (ref 150.0–400.0)
RBC: 4.54 Mil/uL (ref 3.87–5.11)
RDW: 13.4 % (ref 11.5–15.5)
WBC: 9 10*3/uL (ref 4.0–10.5)

## 2019-04-01 LAB — COMPREHENSIVE METABOLIC PANEL
ALT: 30 U/L (ref 0–35)
AST: 24 U/L (ref 0–37)
Albumin: 4.2 g/dL (ref 3.5–5.2)
Alkaline Phosphatase: 79 U/L (ref 39–117)
BUN: 15 mg/dL (ref 6–23)
CO2: 27 mEq/L (ref 19–32)
Calcium: 8.7 mg/dL (ref 8.4–10.5)
Chloride: 103 mEq/L (ref 96–112)
Creatinine, Ser: 0.64 mg/dL (ref 0.40–1.20)
GFR: 95.35 mL/min (ref >60.00)
Glucose, Bld: 97 mg/dL (ref 70–99)
Potassium: 3.9 mEq/L (ref 3.5–5.1)
Sodium: 138 mEq/L (ref 135–145)
Total Bilirubin: 0.4 mg/dL (ref 0.2–1.2)
Total Protein: 6.9 g/dL (ref 6.0–8.3)

## 2019-04-01 LAB — TSH: TSH: 3.87 u[IU]/mL (ref 0.35–4.50)

## 2019-04-01 MED ORDER — LOSARTAN POTASSIUM-HCTZ 50-12.5 MG PO TABS: 1.0000 | ORAL_TABLET | Freq: Every day | ORAL | 3 refills | Status: DC

## 2019-04-01 MED ORDER — VENLAFAXINE HCL ER 75 MG PO CP24: 75.0000 mg | ORAL_CAPSULE | Freq: Every day | ORAL | 1 refills | Status: DC

## 2019-04-01 MED ORDER — TRIAMCINOLONE ACETONIDE 0.1 % EX CREA: TOPICAL_CREAM | CUTANEOUS | 3 refills | Status: DC

## 2019-04-01 MED ORDER — ALPRAZOLAM 0.5 MG PO TABS: 0.5000 mg | ORAL_TABLET | Freq: Every day | ORAL | 0 refills | Status: DC

## 2019-04-01 NOTE — Addendum Note (Signed)
Addended by: Delice Bison E on: 04/01/2019 09:21 AM   Modules accepted: Orders

## 2019-04-01 NOTE — Assessment & Plan Note (Addendum)
taking effexor 75 mg daily Takes xanax about once a week Overall controlled Exercise regularly Continue above

## 2019-04-01 NOTE — Assessment & Plan Note (Signed)
Check a1c Low sugar / carb diet Stressed regular exercise   

## 2019-04-01 NOTE — Assessment & Plan Note (Signed)
Some depression Fairly controlled Increase exercise Continue current dose of effexor

## 2019-04-01 NOTE — Assessment & Plan Note (Signed)
With htn Stressed weight loss Will increase exercise Decrease alcohol use, decrease carbs, sugars and portions F/u in 1 month

## 2019-04-01 NOTE — Assessment & Plan Note (Signed)
Continue triamcinolone prn 

## 2019-04-01 NOTE — Assessment & Plan Note (Signed)
New dx BP has been high for a while and she agrees she probably needs medication Start losartan-hctz 50-12.5 mg  cmp Stressed exercise, weight loss F/u in one month

## 2019-04-02 ENCOUNTER — Encounter: Payer: Self-pay | Admitting: Internal Medicine

## 2019-05-02 NOTE — Patient Instructions (Addendum)
  Blood work was ordered.     Medications reviewed and updated.  Changes include :   Stop current BP medication.  Start losartan-hctz 100-12.5 mg daily  Your prescription(s) have been submitted to your pharmacy. Please take as directed and contact our office if you believe you are having problem(s) with the medication(s).    Please followup in 6 months

## 2019-05-02 NOTE — Progress Notes (Signed)
Subjective:    Patient ID: Andrea Oneal, female    DOB: [birth year], 59 y.o.   MRN: [id number]  HPI The patient is here for follow up.  We started her on losartan-hctz last month for her BP. She is taking the medication daily and denies side effects.    She has not checked her BP at home.  She could do better with salt intake.  She is exercising.  She is drinking less wine.      Medications and allergies reviewed with patient and updated if appropriate.  Patient Active Problem List   Diagnosis Date Noted  . Hypertension 04/01/2019  . Biceps femoris tendinitis 09/17/2018  . Eczema 03/05/2018  . Hyperglycemia 03/05/2018  . Internal hemorrhoid 03/18/2017  . IBS (irritable bowel syndrome) 03/18/2017  . Anxiety 07/01/2016  . Depression 07/01/2016  . Allergic rhinitis 07/01/2016  . Migraines 07/01/2016  . Obese 07/01/2016    Current Outpatient Medications on File Prior to Visit  Medication Sig Dispense Refill  . ALPRAZolam (XANAX) 0.5 MG tablet Take 1 tablet (0.5 mg total) by mouth at bedtime. 30 tablet 0  . losartan-hydrochlorothiazide (HYZAAR) 50-12.5 MG tablet Take 1 tablet by mouth daily. 90 tablet 3  . triamcinolone cream (KENALOG) 0.1 % Apply twice a day to areas with eczema prn 80 g 3  . venlafaxine XR (EFFEXOR XR) 75 MG 24 hr capsule Take 1 capsule (75 mg total) by mouth daily with breakfast. 90 capsule 1   No current facility-administered medications on file prior to visit.    Past Medical History:  Diagnosis Date  . Arthritis   . Depression   . Migraine     Past Surgical History:  Procedure Laterality Date  . APPENDECTOMY    . TONSILLECTOMY  1986    Social History   Socioeconomic History  . Marital status: Divorced    Spouse name: Not on file  . Number of children: Not on file  . Years of education: Not on file  . Highest education level: Not on file  Occupational History  . Not on file  Tobacco Use  . Smoking status: Former Research scientist (life sciences)  .  Smokeless tobacco: Never Used  Substance and Sexual Activity  . Alcohol use: Yes    Alcohol/week: 7.0 standard drinks    Types: 7 Glasses of wine per week  . Drug use: Yes    Types: Marijuana  . Sexual activity: Not on file  Other Topics Concern  . Not on file  Social History Narrative   Has boyfriend, his daughter just moved in -71   Social Determinants of Health   Financial Resource Strain:   . Difficulty of Paying Living Expenses: Not on file  Food Insecurity:   . Worried About Charity fundraiser in the Last Year: Not on file  . Ran Out of Food in the Last Year: Not on file  Transportation Needs:   . Lack of Transportation (Medical): Not on file  . Lack of Transportation (Non-Medical): Not on file  Physical Activity:   . Days of Exercise per Week: Not on file  . Minutes of Exercise per Session: Not on file  Stress:   . Feeling of Stress : Not on file  Social Connections:   . Frequency of Communication with Friends and Family: Not on file  . Frequency of Social Gatherings with Friends and Family: Not on file  . Attends Religious Services: Not on file  . Active Member of Clubs or Organizations:  Not on file  . Attends Banker Meetings: Not on file  . Marital Status: Not on file    Family History  Problem Relation Age of Onset  . Arthritis Mother   . Diabetes Mother   . Depression Mother   . Arthritis Father   . Heart disease Father   . Hypertension Father   . Arthritis Maternal Grandmother   . Diabetes Maternal Grandfather   . Arthritis Paternal Grandmother   . Depression Brother     Review of Systems  Constitutional: Negative for chills and fever.  Respiratory: Negative for cough, shortness of breath and wheezing.   Cardiovascular: Negative for chest pain, palpitations and leg swelling.  Neurological: Negative for light-headedness and headaches.       Objective:   Vitals:   05/03/19 1333  BP: (!) 150/82  Pulse: 80  Resp: 16  Temp: 98.6  F (37 C)  SpO2: 99%   BP Readings from Last 3 Encounters:  05/03/19 (!) 150/82  04/01/19 (!) 156/94  08/30/18 (!) 191/95   Wt Readings from Last 3 Encounters:  05/03/19 204 lb (92.5 kg)  05/03/19 203 lb 1.6 oz (92.1 kg)  04/01/19 199 lb (90.3 kg)   Body mass index is 33.95 kg/m.   Physical Exam    Constitutional: Appears well-developed and well-nourished. No distress.  HENT:  Head: Normocephalic and atraumatic.  Neck: Neck supple. No tracheal deviation present. No thyromegaly present.  No cervical lymphadenopathy Cardiovascular: Normal rate, regular rhythm and normal heart sounds.   No murmur heard. No carotid bruit .  No edema Pulmonary/Chest: Effort normal and breath sounds normal. No respiratory distress. No has no wheezes. No rales.  Skin: Skin is warm and dry. Not diaphoretic.  Psychiatric: Normal mood and affect. Behavior is normal.      Assessment & Plan:    See Problem List for Assessment and Plan of chronic medical problems.    This visit occurred during the SARS-CoV-2 public health emergency.  Safety protocols were in place, including screening questions prior to the visit, additional usage of staff PPE, and extensive cleaning of exam room while observing appropriate contact time as indicated for disinfecting solutions.

## 2019-05-03 ENCOUNTER — Ambulatory Visit: Payer: BC Managed Care – PPO | Admitting: Advanced Practice Midwife

## 2019-05-03 ENCOUNTER — Encounter: Payer: Self-pay | Admitting: Advanced Practice Midwife

## 2019-05-03 ENCOUNTER — Encounter: Payer: Self-pay | Admitting: Internal Medicine

## 2019-05-03 ENCOUNTER — Other Ambulatory Visit: Payer: Self-pay

## 2019-05-03 ENCOUNTER — Ambulatory Visit (INDEPENDENT_AMBULATORY_CARE_PROVIDER_SITE_OTHER): Payer: BC Managed Care – PPO | Admitting: Internal Medicine

## 2019-05-03 VITALS — Wt 203.1 lb

## 2019-05-03 VITALS — BP 150/82 | HR 80 | Temp 98.6°F | Resp 16 | Ht 65.0 in | Wt 204.0 lb

## 2019-05-03 DIAGNOSIS — Z01419 Encounter for gynecological examination (general) (routine) without abnormal findings: Secondary | ICD-10-CM

## 2019-05-03 DIAGNOSIS — I10 Essential (primary) hypertension: Secondary | ICD-10-CM

## 2019-05-03 DIAGNOSIS — Z124 Encounter for screening for malignant neoplasm of cervix: Secondary | ICD-10-CM | POA: Diagnosis not present

## 2019-05-03 DIAGNOSIS — Z1151 Encounter for screening for human papillomavirus (HPV): Secondary | ICD-10-CM | POA: Diagnosis not present

## 2019-05-03 LAB — BASIC METABOLIC PANEL
BUN: 13 mg/dL (ref 6–23)
CO2: 30 mEq/L (ref 19–32)
Calcium: 9.4 mg/dL (ref 8.4–10.5)
Chloride: 100 mEq/L (ref 96–112)
Creatinine, Ser: 0.69 mg/dL (ref 0.40–1.20)
GFR: 87.4 mL/min (ref >60.00)
Glucose, Bld: 93 mg/dL (ref 70–99)
Potassium: 3.7 mEq/L (ref 3.5–5.1)
Sodium: 138 mEq/L (ref 135–145)

## 2019-05-03 MED ORDER — LOSARTAN POTASSIUM-HCTZ 100-12.5 MG PO TABS: 1.0000 | ORAL_TABLET | Freq: Every day | ORAL | 3 refills | Status: DC

## 2019-05-03 NOTE — Assessment & Plan Note (Signed)
Chronic, not controlled Increase losartan-hctz to 100-12.5 mg daily Continue exercise, low-sodium diet and weight loss efforts Encouraged her to check her blood pressure at home BMP Follow-up in 6 months, sooner if blood pressure not controlled

## 2019-05-03 NOTE — Patient Instructions (Signed)

## 2019-05-03 NOTE — Progress Notes (Signed)
GYNECOLOGY ANNUAL PREVENTATIVE CARE ENCOUNTER NOTE  Subjective:   Andrea Oneal is a 59 y.o. No obstetric history on file. female here for a routine annual gynecologic exam.  Current complaints: none.   Denies abnormal vaginal bleeding, discharge, pelvic pain, problems with intercourse or other gynecologic concerns. Some vaginal dryness. Has a mole on her back she would like to me to look at today.     Gynecologic History No LMP recorded. Patient is postmenopausal. at least 16 years since last period.  Contraception: Postmenopausal  Last Pap: 2019. Results were: normal Last mammogram: 2019. Results were: unknown   Obstetric History OB History  No obstetric history on file.    Past Medical History:  Diagnosis Date  . Arthritis   . Depression   . Migraine     Past Surgical History:  Procedure Laterality Date  . APPENDECTOMY    . TONSILLECTOMY  1986    Current Outpatient Medications on File Prior to Visit  Medication Sig Dispense Refill  . ALPRAZolam (XANAX) 0.5 MG tablet Take 1 tablet (0.5 mg total) by mouth at bedtime. 30 tablet 0  . losartan-hydrochlorothiazide (HYZAAR) 50-12.5 MG tablet Take 1 tablet by mouth daily. 90 tablet 3  . triamcinolone cream (KENALOG) 0.1 % Apply twice a day to areas with eczema prn 80 g 3  . venlafaxine XR (EFFEXOR XR) 75 MG 24 hr capsule Take 1 capsule (75 mg total) by mouth daily with breakfast. 90 capsule 1   No current facility-administered medications on file prior to visit.    Allergies  Allergen Reactions  . Beef (Bovine) Protein     Social History   Socioeconomic History  . Marital status: Divorced    Spouse name: Not on file  . Number of children: Not on file  . Years of education: Not on file  . Highest education level: Not on file  Occupational History  . Not on file  Tobacco Use  . Smoking status: Former Games developer  . Smokeless tobacco: Never Used  Substance and Sexual Activity  . Alcohol use: Yes    Alcohol/week: 7.0  standard drinks    Types: 7 Glasses of wine per week  . Drug use: Yes    Types: Marijuana  . Sexual activity: Not on file  Other Topics Concern  . Not on file  Social History Narrative   Has boyfriend, his daughter just moved in -58   Social Determinants of Health   Financial Resource Strain:   . Difficulty of Paying Living Expenses: Not on file  Food Insecurity:   . Worried About Programme researcher, broadcasting/film/video in the Last Year: Not on file  . Ran Out of Food in the Last Year: Not on file  Transportation Needs:   . Lack of Transportation (Medical): Not on file  . Lack of Transportation (Non-Medical): Not on file  Physical Activity:   . Days of Exercise per Week: Not on file  . Minutes of Exercise per Session: Not on file  Stress:   . Feeling of Stress : Not on file  Social Connections:   . Frequency of Communication with Friends and Family: Not on file  . Frequency of Social Gatherings with Friends and Family: Not on file  . Attends Religious Services: Not on file  . Active Member of Clubs or Organizations: Not on file  . Attends Banker Meetings: Not on file  . Marital Status: Not on file  Intimate Partner Violence:   . Fear of Current  or Ex-Partner: Not on file  . Emotionally Abused: Not on file  . Physically Abused: Not on file  . Sexually Abused: Not on file    Family History  Problem Relation Age of Onset  . Arthritis Mother   . Diabetes Mother   . Depression Mother   . Arthritis Father   . Heart disease Father   . Hypertension Father   . Arthritis Maternal Grandmother   . Diabetes Maternal Grandfather   . Arthritis Paternal Grandmother   . Depression Brother     The following portions of the patient's history were reviewed and updated as appropriate: allergies, current medications, past family history, past medical history, past social history, past surgical history and problem list.  Review of Systems Pertinent items noted in HPI and remainder of  comprehensive ROS otherwise negative.   Objective:  Wt 203 lb 1.6 oz (92.1 kg)   BMI 33.80 kg/m  CONSTITUTIONAL: Well-developed, well-nourished female in no acute distress.  HENT:  Normocephalic, atraumatic, External right and left ear normal. Oropharynx is clear and moist EYES: Conjunctivae and EOM are normal. Pupils are equal, round, and reactive to light. No scleral icterus.  NECK: Normal range of motion, supple, no masses.  Normal thyroid.  SKIN: Skin is warm and dry. No rash noted. Not diaphoretic. No erythema. No pallor. 1cm x 1cm sebhorric keratosis on mid back, near right scapula.  NEUROLOGIC: Alert and oriented to person, place, and time. Normal reflexes, muscle tone coordination. No cranial nerve deficit noted. PSYCHIATRIC: Normal mood and affect. Normal behavior. Normal judgment and thought content. CARDIOVASCULAR: Normal heart rate noted, regular rhythm RESPIRATORY: Clear to auscultation bilaterally. Effort and breath sounds normal, no problems with respiration noted. BREASTS: Symmetric in size. No masses, skin changes, nipple drainage, or lymphadenopathy. ABDOMEN: Soft, normal bowel sounds, no distention noted.  No tenderness, rebound or guarding.  PELVIC: Normal appearing external genitalia; normal appearing vaginal mucosa and cervix.  No abnormal discharge noted.  Pap smear obtained.  Normal uterine size, no other palpable masses, no uterine or adnexal tenderness. MUSCULOSKELETAL: Normal range of motion. No tenderness.  No cyanosis, clubbing, or edema.  2+ distal pulses.   Assessment and Plan:  1. Well woman exam with routine gynecological exam - Mammogram Screening Routine; Future - Cytology - PAP( [place]) - Likley sebhorric keratosis, consider derm referral   FU with PCP as scheduled  Will follow up results of pap smear and manage accordingly. Mammogram scheduled Routine preventative health maintenance measures emphasized. Please refer to After Visit Summary for  other counseling recommendations.   Marcille Buffy DNP, CNM  05/03/19  8:51 AM

## 2019-05-04 ENCOUNTER — Other Ambulatory Visit: Payer: Self-pay | Admitting: Internal Medicine

## 2019-05-04 ENCOUNTER — Encounter: Payer: Self-pay | Admitting: Internal Medicine

## 2019-05-04 DIAGNOSIS — Z209 Contact with and (suspected) exposure to unspecified communicable disease: Secondary | ICD-10-CM | POA: Diagnosis not present

## 2019-05-06 LAB — CYTOLOGY - PAP
Comment: NEGATIVE
Diagnosis: UNDETERMINED — AB
High risk HPV: NEGATIVE

## 2019-05-10 ENCOUNTER — Encounter: Payer: Self-pay | Admitting: Lactation Services

## 2019-05-10 ENCOUNTER — Other Ambulatory Visit: Payer: Self-pay | Admitting: Internal Medicine

## 2019-05-10 DIAGNOSIS — Z1231 Encounter for screening mammogram for malignant neoplasm of breast: Secondary | ICD-10-CM

## 2019-05-13 ENCOUNTER — Other Ambulatory Visit: Payer: Self-pay

## 2019-05-13 ENCOUNTER — Ambulatory Visit
Admission: RE | Admit: 2019-05-13 | Discharge: 2019-05-13 | Disposition: A | Discharge status: Home / Self Care | Payer: BC Managed Care – PPO | Source: Ambulatory Visit

## 2019-05-13 DIAGNOSIS — Z1231 Encounter for screening mammogram for malignant neoplasm of breast: Secondary | ICD-10-CM

## 2019-08-08 ENCOUNTER — Other Ambulatory Visit: Payer: Self-pay

## 2019-08-08 ENCOUNTER — Encounter (HOSPITAL_COMMUNITY): Payer: Self-pay

## 2019-08-08 DIAGNOSIS — I10 Essential (primary) hypertension: Secondary | ICD-10-CM | POA: Diagnosis present

## 2019-08-08 DIAGNOSIS — E876 Hypokalemia: Secondary | ICD-10-CM | POA: Diagnosis not present

## 2019-08-08 DIAGNOSIS — Z87891 Personal history of nicotine dependence: Secondary | ICD-10-CM | POA: Insufficient documentation

## 2019-08-08 DIAGNOSIS — Z79899 Other long term (current) drug therapy: Secondary | ICD-10-CM | POA: Diagnosis not present

## 2019-08-08 LAB — CBC
HCT: 38.9 % (ref 36.0–46.0)
Hemoglobin: 13 g/dL (ref 12.0–15.0)
MCH: 30.1 pg (ref 26.0–34.0)
MCHC: 33.4 g/dL (ref 30.0–36.0)
MCV: 90 fL (ref 80.0–100.0)
Platelets: 282 10*3/uL (ref 150–400)
RBC: 4.32 MIL/uL (ref 3.87–5.11)
RDW: 12.2 % (ref 11.5–15.5)
WBC: 9.3 10*3/uL (ref 4.0–10.5)
nRBC: 0 % (ref 0.0–0.2)

## 2019-08-08 LAB — BASIC METABOLIC PANEL
Anion gap: 11 (ref 5–15)
BUN: 12 mg/dL (ref 6–20)
CO2: 26 mmol/L (ref 22–32)
Calcium: 9.3 mg/dL (ref 8.9–10.3)
Chloride: 102 mmol/L (ref 98–111)
Creatinine, Ser: 0.7 mg/dL (ref 0.44–1.00)
GFR calc Af Amer: >60 mL/min (ref >60)
GFR calc non Af Amer: >60 mL/min (ref >60)
Glucose, Bld: 97 mg/dL (ref 70–99)
Potassium: 3.2 mmol/L — ABNORMAL LOW (ref 3.5–5.1)
Sodium: 139 mmol/L (ref 135–145)

## 2019-08-08 MED ORDER — SODIUM CHLORIDE 0.9% FLUSH: 3.0000 mL | Freq: Once | INTRAVENOUS | Status: DC

## 2019-08-08 MED ORDER — ONDANSETRON 4 MG PO TBDP
4.0000 mg | ORAL_TABLET | Freq: Once | ORAL | Status: AC | PRN
  Administered 2019-08-08: 4 mg via ORAL
  Filled 2019-08-08: qty 1

## 2019-08-08 NOTE — ED Triage Notes (Signed)
Patient arrived stating today at the dentist she was told her blood pressure was elevated, throughout the day it has not decreased. Reports taking one extra blood pressure pill. States she has been feeling dizzy for a few weeks but became nauseated and headache today.  Also reporting a syncopal episode yesterday, denies hitting her head.

## 2019-08-09 ENCOUNTER — Encounter: Payer: Self-pay | Admitting: Internal Medicine

## 2019-08-09 ENCOUNTER — Emergency Department (HOSPITAL_COMMUNITY)
Admission: EM | Admit: 2019-08-09 | Discharge: 2019-08-09 | Disposition: A | Discharge status: Home / Self Care | Payer: Commercial Managed Care - PPO | Attending: Emergency Medicine | Admitting: Emergency Medicine

## 2019-08-09 DIAGNOSIS — I10 Essential (primary) hypertension: Secondary | ICD-10-CM

## 2019-08-09 DIAGNOSIS — E876 Hypokalemia: Secondary | ICD-10-CM

## 2019-08-09 HISTORY — DX: Essential (primary) hypertension: I10

## 2019-08-09 LAB — URINALYSIS, ROUTINE W REFLEX MICROSCOPIC
Bilirubin Urine: NEGATIVE
Glucose, UA: NEGATIVE mg/dL
Hgb urine dipstick: NEGATIVE
Ketones, ur: NEGATIVE mg/dL
Leukocytes,Ua: NEGATIVE
Nitrite: NEGATIVE
Protein, ur: NEGATIVE mg/dL
Specific Gravity, Urine: 1.004 — ABNORMAL LOW (ref 1.005–1.030)
pH: 7 (ref 5.0–8.0)

## 2019-08-09 MED ORDER — POTASSIUM CHLORIDE CRYS ER 20 MEQ PO TBCR: 20.0000 meq | EXTENDED_RELEASE_TABLET | Freq: Two times a day (BID) | ORAL | 0 refills | Status: DC

## 2019-08-09 NOTE — ED Provider Notes (Signed)
[facility]-EMERGENCY DEPT Provider Note   CSN: [id number] Arrival date & time: 08/08/19  1937     History Chief Complaint  Patient presents with  . Hypertension    Andrea Oneal is a 59 y.o. female.  Patient to ED concerned about elevated blood pressure noted 2-3 days ago while at the dentist. She takes Lisinopril, first prescribed last year with increase to dose in January of this year, which was the last time she was seen by her doctor. She denies chest pain, SOB. She does endorse dizziness for the last several weeks when she stands up from lying or sitting. She states this caused a brief syncopal episode yesterday.   The history is provided by the patient. No language interpreter was used.       Past Medical History:  Diagnosis Date  . Arthritis   . Depression   . Hypertension   . Migraine     Patient Active Problem List   Diagnosis Date Noted  . Hypertension 04/01/2019  . Biceps femoris tendinitis 09/17/2018  . Eczema 03/05/2018  . Hyperglycemia 03/05/2018  . Internal hemorrhoid 03/18/2017  . IBS (irritable bowel syndrome) 03/18/2017  . Anxiety 07/01/2016  . Depression 07/01/2016  . Allergic rhinitis 07/01/2016  . Migraines 07/01/2016  . Obese 07/01/2016    Past Surgical History:  Procedure Laterality Date  . APPENDECTOMY    . TONSILLECTOMY  1986     OB History   No obstetric history on file.     Family History  Problem Relation Age of Onset  . Arthritis Mother   . Diabetes Mother   . Depression Mother   . Arthritis Father   . Heart disease Father   . Hypertension Father   . Arthritis Maternal Grandmother   . Diabetes Maternal Grandfather   . Arthritis Paternal Grandmother   . Depression Brother     Social History   Tobacco Use  . Smoking status: Former Games developer  . Smokeless tobacco: Never Used  Substance Use Topics  . Alcohol use: Yes    Alcohol/week: 7.0 standard drinks    Types: 7 Glasses of wine per week  .  Drug use: Yes    Types: Marijuana    Home Medications Prior to Admission medications   Medication Sig Start Date End Date Taking? Authorizing Provider  ALPRAZolam Prudy Feeler) 0.5 MG tablet Take 1 tablet (0.5 mg total) by mouth at bedtime. 04/01/19  Yes Burns, Bobette Mo, MD  losartan-hydrochlorothiazide (HYZAAR) 100-12.5 MG tablet Take 1 tablet by mouth daily. 05/03/19  Yes Burns, Bobette Mo, MD  Multiple Vitamin (MULTIVITAMIN WITH MINERALS) TABS tablet Take 1 tablet by mouth daily.   Yes [provider]  triamcinolone cream (KENALOG) 0.1 % Apply twice a day to areas with eczema prn Patient taking differently: Apply 1 application topically 2 (two) times daily as needed (eczema).  04/01/19  Yes Pincus Sanes, MD  venlafaxine XR (EFFEXOR XR) 75 MG 24 hr capsule Take 1 capsule (75 mg total) by mouth daily with breakfast. 04/01/19  Yes Burns, Bobette Mo, MD  venlafaxine XR (EFFEXOR-XR) 37.5 MG 24 hr capsule TAKE 1 CAPSULE BY MOUTH EVERY DAY WITH BREAKFAST Patient not taking: Reported on 08/09/2019 05/04/19   Pincus Sanes, MD    Allergies    Beef (bovine) protein  Review of Systems   Review of Systems  Constitutional: Negative for chills, diaphoresis and fever.  HENT: Negative.   Respiratory: Negative.  Negative for shortness of breath.  Cardiovascular: Negative.  Negative for chest pain.  Gastrointestinal: Negative.  Negative for nausea.  Musculoskeletal: Negative.   Skin: Negative.   Neurological: Positive for dizziness and syncope. Negative for weakness.    Physical Exam Updated Vital Signs BP (!) 178/97 (BP Location: Right Arm)   Pulse 76   Temp 97.7 F (36.5 C) (Oral)   Resp 15   Ht 5\' 6"  (1.676 m)   Wt 89.8 kg   SpO2 98%   BMI 31.96 kg/m   Physical Exam Vitals and nursing note reviewed.  Constitutional:      Appearance: She is well-developed.  HENT:     Head: Normocephalic.  Cardiovascular:     Rate and Rhythm: Normal rate and regular rhythm.  Pulmonary:     Effort:  Pulmonary effort is normal.     Breath sounds: Normal breath sounds.  Abdominal:     General: Bowel sounds are normal.     Palpations: Abdomen is soft.     Tenderness: There is no abdominal tenderness. There is no guarding or rebound.  Musculoskeletal:        General: Normal range of motion.     Cervical back: Normal range of motion and neck supple.     Right lower leg: No edema.     Left lower leg: No edema.  Skin:    General: Skin is warm and dry.  Neurological:     Mental Status: She is alert and oriented to person, place, and time.     ED Results / Procedures / Treatments   Labs (all labs ordered are listed, but only abnormal results are displayed) Labs Reviewed  BASIC METABOLIC PANEL - Abnormal; Notable for the following components:      Result Value   Potassium 3.2 (*)    All other components within normal limits  URINALYSIS, ROUTINE W REFLEX MICROSCOPIC - Abnormal; Notable for the following components:   Color, Urine STRAW (*)    Specific Gravity, Urine 1.004 (*)    All other components within normal limits  CBC   Results for orders placed or performed during the hospital encounter of 08/09/19  Basic metabolic panel  Result Value Ref Range   Sodium 139 135 - 145 mmol/L   Potassium 3.2 (L) 3.5 - 5.1 mmol/L   Chloride 102 98 - 111 mmol/L   CO2 26 22 - 32 mmol/L   Glucose, Bld 97 70 - 99 mg/dL   BUN 12 6 - 20 mg/dL   Creatinine, Ser 08/11/19 0.44 - 1.00 mg/dL   Calcium 9.3 8.9 - 7.82 mg/dL   GFR calc non Af Amer >60 >60 mL/min   GFR calc Af Amer >60 >60 mL/min   Anion gap 11 5 - 15  CBC  Result Value Ref Range   WBC 9.3 4.0 - 10.5 K/uL   RBC 4.32 3.87 - 5.11 MIL/uL   Hemoglobin 13.0 12.0 - 15.0 g/dL   HCT 95.6 21.3 - 08.6 %   MCV 90.0 80.0 - 100.0 fL   MCH 30.1 26.0 - 34.0 pg   MCHC 33.4 30.0 - 36.0 g/dL   RDW 57.8 46.9 - 62.9 %   Platelets 282 150 - 400 K/uL   nRBC 0.0 0.0 - 0.2 %  Urinalysis, Routine w reflex microscopic  Result Value Ref Range   Color,  Urine STRAW (A) YELLOW   APPearance CLEAR CLEAR   Specific Gravity, Urine 1.004 (L) 1.005 - 1.030   pH 7.0 5.0 - 8.0   Glucose,  UA NEGATIVE NEGATIVE mg/dL   Hgb urine dipstick NEGATIVE NEGATIVE   Bilirubin Urine NEGATIVE NEGATIVE   Ketones, ur NEGATIVE NEGATIVE mg/dL   Protein, ur NEGATIVE NEGATIVE mg/dL   Nitrite NEGATIVE NEGATIVE   Leukocytes,Ua NEGATIVE NEGATIVE    EKG EKG Interpretation  Date/Time:  Monday August 08 2019 20:14:53 EDT Ventricular Rate:  81 PR Interval:    QRS Duration: 94 QT Interval:  364 QTC Calculation: 423 R Axis:   -74 Text Interpretation: Sinus rhythm Left anterior fascicular block RSR' in V1 or V2, right VCD or RVH Confirmed by Veryl Speak [phone]) on 08/08/2019 8:19:10 PM   Radiology No results found.  Procedures Procedures (including critical care time)  Medications Ordered in ED Medications  ondansetron (ZOFRAN-ODT) disintegrating tablet 4 mg (4 mg Oral Given 08/08/19 2015)    ED Course  I have reviewed the triage vital signs and the nursing notes.  Pertinent labs & imaging results that were available during my care of the patient were reviewed by me and considered in my medical decision making (see chart for details).    MDM Rules/Calculators/A&P                      Patient to ED concerned with elevated blood pressure as detailed in the HPI.   She is very well appearing. BP elevated, improves over time in ED. No chest pain. No concerning EKG changes, renal dysfunction. Orthostatic VS normal and patient reports no dizziness. Potassium slightly low at 3.2.  She can be discharged home and is encouraged to see her doctor soon. It is recommended to her that she measure her blood pressure twice daily, keep a journal of measurements and take this information with her to her PCP appointment.  Final Clinical Impression(s) / ED Diagnoses Final diagnoses:  None   1. Hypertension 2. Hypokalemia   Rx / DC Orders ED Discharge Orders    None        Charlann Lange, Hershal Coria 08/09/19 1937    Ripley Fraise, MD 08/09/19 [phone]

## 2019-08-09 NOTE — Discharge Instructions (Addendum)
Your labs and exam are reassuring. Your blood pressure needs to be re-evaluated by seeing your doctor this week for possible medication changes.   Return to the emergency department with any new or worsening symptoms.   Take potassium twice daily for the next 3 days.

## 2019-08-09 NOTE — Progress Notes (Signed)
Subjective:    Patient ID: Andrea Oneal, female    DOB: 1961/01/02, 59 y.o.   MRN: [id number]  HPI The patient is here for follow up from the ED.   she went to the ED 08/09/19 for elevated BP.  Couple weeks ago she went for an eye appointment and her blood pressure was good there., But  2-3 days prior she was at the dentist and her BP was elevated.  She denied chest pain, SOB.  She had some dizziness for several weeks when she stood up from lying or sitting.  This caused a brief syncopal episode the day prior.    BP in the ED 178/97.  Exam was normal. Potassium was low at 3.2.  Other blood work, UA normal.  EKG NSR, LAFB, RSR in V1, V2 or RVH.  She was prescribed a few days of potassium and advised follow up with me.   She is taking her blood pressure medication as prescribed. She is not checking her blood pressure on a daily basis, but when she did check it before it was well controlled. She is unsure if it is elevated because of the dizziness or something else.  The dizziness is not necessarily new to her.  She has had dizziness and she feels it is vertigo from seasonal allergies,which she often in the spring.  She has nausea w/ and without the dizziness. She has vomited a few times and vomited earlier today. She did receive Zofran in the ED. She has a cousin who is Mnire's disease and she does wonder if that is a possibility. She has taken meclizine and has some at home and sometimes that helps some.  She is currently nauseous now. He has not had any chest pain, palpitations or shortness of breath.  Her blood pressure at home since leaving the ED at one point was in the 180s.  Medications and allergies reviewed with patient and updated if appropriate.  Patient Active Problem List   Diagnosis Date Noted  . Vertigo 08/10/2019  . Hypertension 04/01/2019  . Biceps femoris tendinitis 09/17/2018  . Eczema 03/05/2018  . Hyperglycemia 03/05/2018  . Internal hemorrhoid 03/18/2017   . IBS (irritable bowel syndrome) 03/18/2017  . Anxiety 07/01/2016  . Depression 07/01/2016  . Allergic rhinitis 07/01/2016  . Migraines 07/01/2016  . Obese 07/01/2016    Current Outpatient Medications on File Prior to Visit  Medication Sig Dispense Refill  . ALPRAZolam (XANAX) 0.5 MG tablet Take 1 tablet (0.5 mg total) by mouth at bedtime. 30 tablet 0  . Multiple Vitamin (MULTIVITAMIN WITH MINERALS) TABS tablet Take 1 tablet by mouth daily.    . potassium chloride SA (KLOR-CON) 20 MEQ tablet Take 1 tablet (20 mEq total) by mouth 2 (two) times daily for 3 days. 6 tablet 0  . triamcinolone cream (KENALOG) 0.1 % Apply twice a day to areas with eczema prn (Patient taking differently: Apply 1 application topically 2 (two) times daily as needed (eczema). ) 80 g 3  . venlafaxine XR (EFFEXOR XR) 75 MG 24 hr capsule Take 1 capsule (75 mg total) by mouth daily with breakfast. 90 capsule 1  . [DISCONTINUED] losartan-hydrochlorothiazide (HYZAAR) 100-12.5 MG tablet Take 1 tablet by mouth daily. 90 tablet 3   No current facility-administered medications on file prior to visit.    Past Medical History:  Diagnosis Date  . Arthritis   . Depression   . Hypertension   . Migraine     Past Surgical History:  Procedure Laterality Date  . APPENDECTOMY    . TONSILLECTOMY  1986    Social History   Socioeconomic History  . Marital status: Divorced    Spouse name: Not on file  . Number of children: Not on file  . Years of education: Not on file  . Highest education level: Not on file  Occupational History  . Not on file  Tobacco Use  . Smoking status: Former Research scientist (life sciences)  . Smokeless tobacco: Never Used  Substance and Sexual Activity  . Alcohol use: Yes    Alcohol/week: 7.0 standard drinks    Types: 7 Glasses of wine per week  . Drug use: Yes    Types: Marijuana  . Sexual activity: Not on file  Other Topics Concern  . Not on file  Social History Narrative   Has boyfriend, his daughter just  moved in -87   Social Determinants of Health   Financial Resource Strain:   . Difficulty of Paying Living Expenses:   Food Insecurity:   . Worried About Charity fundraiser in the Last Year:   . Arboriculturist in the Last Year:   Transportation Needs:   . Film/video editor (Medical):   Marland Kitchen Lack of Transportation (Non-Medical):   Physical Activity:   . Days of Exercise per Week:   . Minutes of Exercise per Session:   Stress:   . Feeling of Stress :   Social Connections:   . Frequency of Communication with Friends and Family:   . Frequency of Social Gatherings with Friends and Family:   . Attends Religious Services:   . Active Member of Clubs or Organizations:   . Attends Archivist Meetings:   Marland Kitchen Marital Status:     Family History  Problem Relation Age of Onset  . Arthritis Mother   . Diabetes Mother   . Depression Mother   . Arthritis Father   . Heart disease Father   . Hypertension Father   . Arthritis Maternal Grandmother   . Diabetes Maternal Grandfather   . Arthritis Paternal Grandmother   . Depression Brother     Review of Systems  Constitutional: Negative for chills and fever.  HENT: Positive for hearing loss (chronic) and sinus pressure (mild). Negative for ear pain.   Eyes: Negative for visual disturbance.  Cardiovascular: Negative for chest pain, palpitations and leg swelling.  Gastrointestinal: Positive for nausea and vomiting.  Neurological: Positive for dizziness and headaches (when went to ED). Negative for weakness and numbness.       Objective:   Vitals:   08/10/19 0953  BP: (!) 148/94  Pulse: 77  Resp: 16  Temp: 98.5 F (36.9 C)  SpO2: 97%   BP Readings from Last 3 Encounters:  08/10/19 (!) 148/94  08/09/19 (!) 178/97  05/03/19 (!) 150/82   Wt Readings from Last 3 Encounters:  08/10/19 200 lb (90.7 kg)  08/08/19 198 lb (89.8 kg)  05/03/19 204 lb (92.5 kg)   Body mass index is 32.28 kg/m.   Physical Exam     Constitutional: Appears slightly ill-currently nauseous and has intermittent dizziness sensation. No distress.  HENT:  Head: Normocephalic and atraumatic.  Neck: Neck supple. No tracheal deviation present. No thyromegaly present.  No cervical lymphadenopathy Cardiovascular: Normal rate, regular rhythm and normal heart sounds.   No murmur heard. No carotid bruit .  No edema Pulmonary/Chest: Effort normal and breath sounds normal. No respiratory distress. No has no wheezes. No rales.  Skin:  Skin is warm and dry. Not diaphoretic.  Psychiatric: Normal mood and affect. Behavior is normal.      Assessment & Plan:    See Problem List for Assessment and Plan of chronic medical problems.    This visit occurred during the SARS-CoV-2 public health emergency.  Safety protocols were in place, including screening questions prior to the visit, additional usage of staff PPE, and extensive cleaning of exam room while observing appropriate contact time as indicated for disinfecting solutions.

## 2019-08-09 NOTE — Patient Instructions (Addendum)
   Medications reviewed and updated.  Changes include :   Increase losartan - hctz to 100-25 mg daily.  Start amlodipine 5 mg daily.   zofran as needed  Your prescription(s) have been submitted to your pharmacy. Please take as directed and contact our office if you believe you are having problem(s) with the medication(s).  A referral was ordered for ENT.       Someone will call you to schedule this.     Follow up in 1 month

## 2019-08-10 ENCOUNTER — Other Ambulatory Visit: Payer: Self-pay

## 2019-08-10 ENCOUNTER — Encounter: Payer: Self-pay | Admitting: Internal Medicine

## 2019-08-10 ENCOUNTER — Ambulatory Visit (INDEPENDENT_AMBULATORY_CARE_PROVIDER_SITE_OTHER): Payer: Commercial Managed Care - PPO | Admitting: Internal Medicine

## 2019-08-10 VITALS — BP 148/94 | HR 77 | Temp 98.5°F | Resp 16 | Ht 66.0 in | Wt 200.0 lb

## 2019-08-10 DIAGNOSIS — R42 Dizziness and giddiness: Secondary | ICD-10-CM | POA: Insufficient documentation

## 2019-08-10 DIAGNOSIS — R110 Nausea: Secondary | ICD-10-CM

## 2019-08-10 DIAGNOSIS — I10 Essential (primary) hypertension: Secondary | ICD-10-CM | POA: Diagnosis not present

## 2019-08-10 MED ORDER — LOSARTAN POTASSIUM-HCTZ 100-25 MG PO TABS: 1.0000 | ORAL_TABLET | Freq: Every day | ORAL | 3 refills | Status: DC

## 2019-08-10 MED ORDER — LORATADINE 10 MG PO TABS: 10.0000 mg | ORAL_TABLET | Freq: Every day | ORAL | 11 refills | Status: DC

## 2019-08-10 MED ORDER — ONDANSETRON 4 MG PO TBDP
4.0000 mg | ORAL_TABLET | Freq: Once | ORAL | Status: AC
  Administered 2019-08-10: 4 mg via ORAL

## 2019-08-10 MED ORDER — FLUTICASONE PROPIONATE 50 MCG/ACT NA SUSP: 2.0000 | Freq: Every day | NASAL | 6 refills | Status: DC

## 2019-08-10 MED ORDER — AMLODIPINE BESYLATE 5 MG PO TABS: 5.0000 mg | ORAL_TABLET | Freq: Every day | ORAL | 3 refills | Status: DC

## 2019-08-10 MED ORDER — ONDANSETRON 4 MG PO TBDP: 4.0000 mg | ORAL_TABLET | Freq: Three times a day (TID) | ORAL | 0 refills | Status: DC | PRN

## 2019-08-10 NOTE — Assessment & Plan Note (Signed)
Acute, but has had this intermittently throughout the years Tends to occur with spring allergies ? Related to many years-one of her cousins has this Currently having episodic vertigo, nausea and vomiting. She does have some hearing loss in her right ear Taking meclizine as needed-continue We will start Zofran 4 mg every 8 hours as needed for nausea Increase fluids Blood pressure is not controlled and will increase hydrochlorothiazide to 25 mg daily, which may help in the event that this is many years Stressed low-sodium diet Will refer to ENT

## 2019-08-10 NOTE — Assessment & Plan Note (Signed)
Chronic Not ideally controlled, but it is difficult to say if it is elevated to some degree because of the vertigo/nausea/vomiting or if it is all related to poorly controlled hypertension Will increase losartan-hydrochlorothiazide 100-12.5 mg to 100-25 mg daily I will also add amlodipine 5 mg daily Advised her to limit salt intake Monitor BP at home and let me know if there is any concerns Follow-up in 1 month

## 2019-08-12 ENCOUNTER — Encounter: Payer: Self-pay | Admitting: Internal Medicine

## 2019-08-31 ENCOUNTER — Ambulatory Visit (INDEPENDENT_AMBULATORY_CARE_PROVIDER_SITE_OTHER): Payer: Commercial Managed Care - PPO | Admitting: Otolaryngology

## 2019-08-31 ENCOUNTER — Other Ambulatory Visit: Payer: Self-pay

## 2019-08-31 ENCOUNTER — Encounter (INDEPENDENT_AMBULATORY_CARE_PROVIDER_SITE_OTHER): Payer: Self-pay | Admitting: Otolaryngology

## 2019-08-31 VITALS — Temp 97.9°F

## 2019-08-31 DIAGNOSIS — R42 Dizziness and giddiness: Secondary | ICD-10-CM

## 2019-08-31 NOTE — Progress Notes (Signed)
HPI: Andrea Oneal is a 59 y.o. female who presents is referred by Dr. Quay Burow for evaluation of dizziness.  Patient states that she has had dizziness on and off for a number of years.  She also reports that she has intermittent episodes of "vertigo".  Sometimes this is associated when she stands up too quickly. She has not noted any hearing loss although she does complain of some popping in her ears. She also notices that she has more dizzy episodes when she has allergies in the spring and fall.  She takes Flonase and Claritin for allergies. Apparently a relative of hers was diagnosed with Mnire's disease and she wonders if she could have Mnire's disease.  Past Medical History:  Diagnosis Date  . Arthritis   . Depression   . Hypertension   . Migraine    Past Surgical History:  Procedure Laterality Date  . APPENDECTOMY    . TONSILLECTOMY  1986   Social History   Socioeconomic History  . Marital status: Divorced    Spouse name: Not on file  . Number of children: Not on file  . Years of education: Not on file  . Highest education level: Not on file  Occupational History  . Not on file  Tobacco Use  . Smoking status: Former Smoker    Packs/day: 0.25    Years: 16.00    Pack years: 4.00    Start date: 102    Quit date: 1994    Years since quitting: 27.3  . Smokeless tobacco: Never Used  Substance and Sexual Activity  . Alcohol use: Yes    Alcohol/week: 7.0 standard drinks    Types: 7 Glasses of wine per week  . Drug use: Yes    Types: Marijuana  . Sexual activity: Not on file  Other Topics Concern  . Not on file  Social History Narrative   Has boyfriend, his daughter just moved in -43   Social Determinants of Health   Financial Resource Strain:   . Difficulty of Paying Living Expenses:   Food Insecurity:   . Worried About Charity fundraiser in the Last Year:   . Arboriculturist in the Last Year:   Transportation Needs:   . Film/video editor (Medical):    Marland Kitchen Lack of Transportation (Non-Medical):   Physical Activity:   . Days of Exercise per Week:   . Minutes of Exercise per Session:   Stress:   . Feeling of Stress :   Social Connections:   . Frequency of Communication with Friends and Family:   . Frequency of Social Gatherings with Friends and Family:   . Attends Religious Services:   . Active Member of Clubs or Organizations:   . Attends Archivist Meetings:   Marland Kitchen Marital Status:    Family History  Problem Relation Age of Onset  . Arthritis Mother   . Diabetes Mother   . Depression Mother   . Arthritis Father   . Heart disease Father   . Hypertension Father   . Arthritis Maternal Grandmother   . Diabetes Maternal Grandfather   . Arthritis Paternal Grandmother   . Depression Brother    Allergies  Allergen Reactions  . Beef (Bovine) Protein    Prior to Admission medications   Medication Sig Start Date End Date Taking? Authorizing Provider  ALPRAZolam Duanne Moron) 0.5 MG tablet Take 1 tablet (0.5 mg total) by mouth at bedtime. 04/01/19  Yes Binnie Rail, MD  amLODipine (  NORVASC) 5 MG tablet Take 1 tablet (5 mg total) by mouth daily. 08/10/19  Yes Burns, Bobette Mo, MD  fluticasone (FLONASE) 50 MCG/ACT nasal spray Place 2 sprays into both nostrils daily. 08/10/19  Yes Burns, Bobette Mo, MD  loratadine (CLARITIN) 10 MG tablet Take 1 tablet (10 mg total) by mouth daily. 08/10/19  Yes Burns, Bobette Mo, MD  losartan-hydrochlorothiazide (HYZAAR) 100-25 MG tablet Take 1 tablet by mouth daily. 08/10/19  Yes Burns, Bobette Mo, MD  Multiple Vitamin (MULTIVITAMIN WITH MINERALS) TABS tablet Take 1 tablet by mouth daily.   Yes [provider]  ondansetron (ZOFRAN ODT) 4 MG disintegrating tablet Take 1 tablet (4 mg total) by mouth every 8 (eight) hours as needed for nausea or vomiting. 08/10/19  Yes Burns, Bobette Mo, MD  triamcinolone cream (KENALOG) 0.1 % Apply twice a day to areas with eczema prn Patient taking differently: Apply 1 application  topically 2 (two) times daily as needed (eczema).  04/01/19  Yes Pincus Sanes, MD  venlafaxine XR (EFFEXOR XR) 75 MG 24 hr capsule Take 1 capsule (75 mg total) by mouth daily with breakfast. 04/01/19  Yes Burns, Bobette Mo, MD  potassium chloride SA (KLOR-CON) 20 MEQ tablet Take 1 tablet (20 mEq total) by mouth 2 (two) times daily for 3 days. 08/09/19 08/12/19  Elpidio Anis, PA-C     Positive ROS: Otherwise negative  All other systems have been reviewed and were otherwise negative with the exception of those mentioned in the HPI and as above.  Physical Exam: Constitutional: Alert, well-appearing, no acute distress Ears: External ears without lesions or tenderness.  She has minimal wax buildup.  TMs were clear bilaterally with no middle ear fluid noted.  Hearing screen with a 512 1024 tuning fork she heard well in both ears with AC > BC bilaterally.  On Dix-Hallpike testing she had no clinical evidence of BPPV in the office today. Nasal: External nose without lesions.  Moderate nasal mucosal swelling consistent with reactive rhinitis.  Both middle meatus regions were clear with no signs of active infection. Oral: Lips and gums without lesions. Tongue and palate mucosa without lesions. Posterior oropharynx clear.  She is status post tonsillectomy. Neck: No palpable adenopathy or masses.  Auscultation of the neck reveals no carotid bruits. Respiratory: Breathing comfortably  Skin: No facial/neck lesions or rash noted.  Procedures  Assessment: Dizziness questionable etiology.  Plan: We will plan on scheduling patient for VNG testing and audiologic testing to evaluate for any vestibular abnormality or Mnire's disease. She will follow-up following the above test.   Narda Bonds, MD   CC:

## 2019-09-21 ENCOUNTER — Other Ambulatory Visit: Payer: Self-pay | Admitting: Internal Medicine

## 2019-09-22 MED ORDER — ALPRAZOLAM 0.5 MG PO TABS: 0.5000 mg | ORAL_TABLET | Freq: Every day | ORAL | 0 refills | Status: DC

## 2019-09-22 NOTE — Telephone Encounter (Signed)
Last RF 08/10/19 Next OV NA Last RF 04/01/19

## 2019-09-28 ENCOUNTER — Other Ambulatory Visit: Payer: Self-pay | Admitting: Internal Medicine

## 2019-10-03 ENCOUNTER — Ambulatory Visit (INDEPENDENT_AMBULATORY_CARE_PROVIDER_SITE_OTHER): Payer: Commercial Managed Care - PPO | Admitting: Otolaryngology

## 2019-10-23 ENCOUNTER — Other Ambulatory Visit: Payer: Self-pay | Admitting: Internal Medicine

## 2019-12-27 ENCOUNTER — Other Ambulatory Visit: Payer: Self-pay | Admitting: Internal Medicine

## 2019-12-27 ENCOUNTER — Encounter: Payer: Self-pay | Admitting: Internal Medicine

## 2019-12-27 MED ORDER — POTASSIUM CHLORIDE CRYS ER 20 MEQ PO TBCR: 20.0000 meq | EXTENDED_RELEASE_TABLET | Freq: Two times a day (BID) | ORAL | 0 refills | Status: DC

## 2019-12-27 MED ORDER — VENLAFAXINE HCL ER 75 MG PO CP24: ORAL_CAPSULE | ORAL | 0 refills | Status: DC

## 2020-01-08 ENCOUNTER — Ambulatory Visit
Admission: EM | Admit: 2020-01-08 | Discharge: 2020-01-08 | Disposition: A | Discharge status: Home / Self Care | Payer: Commercial Managed Care - PPO | Attending: Emergency Medicine | Admitting: Emergency Medicine

## 2020-01-08 ENCOUNTER — Encounter: Payer: Self-pay | Admitting: Emergency Medicine

## 2020-01-08 ENCOUNTER — Other Ambulatory Visit: Payer: Self-pay

## 2020-01-08 DIAGNOSIS — Z1152 Encounter for screening for COVID-19: Secondary | ICD-10-CM

## 2020-01-08 DIAGNOSIS — B349 Viral infection, unspecified: Secondary | ICD-10-CM

## 2020-01-08 NOTE — Discharge Instructions (Addendum)
Your COVID test is pending - it is important to quarantine / isolate at home until your results are back. °If you test positive and would like further evaluation for persistent or worsening symptoms, you may schedule an E-visit or virtual (video) visit throughout the [facility] app or website. ° °PLEASE NOTE: If you develop severe chest pain or shortness of breath please go to the ER or call 9-1-1 for further evaluation --> DO NOT schedule electronic or virtual visits for this. °Please call our office for further guidance / recommendations as needed. ° °For information about the Covid vaccine, please visit [email or web address] °

## 2020-01-08 NOTE — ED Provider Notes (Signed)
[facility]    CSN: [id number] Arrival date & time: 01/08/20  1312      History   Chief Complaint Chief Complaint  Patient presents with  . Nasal Congestion  . Diarrhea  . Generalized Body Aches    HPI Andrea Oneal is a 59 y.o. female  Presenting for 3-day course of nasal congestion, scratchy throat, nausea, diarrhea.  States that she has nausea medication at home that has been helping with this.  No fever, dental pain, difficulty breathing or swallowing, cough, chest pain, vomiting.  No known exposures.  Patient is fully Covid vaccinated.  Past Medical History:  Diagnosis Date  . Arthritis   . Depression   . Hypertension   . Migraine     Patient Active Problem List   Diagnosis Date Noted  . Vertigo 08/10/2019  . Hypertension 04/01/2019  . Biceps femoris tendinitis 09/17/2018  . Eczema 03/05/2018  . Hyperglycemia 03/05/2018  . Internal hemorrhoid 03/18/2017  . IBS (irritable bowel syndrome) 03/18/2017  . Anxiety 07/01/2016  . Depression 07/01/2016  . Allergic rhinitis 07/01/2016  . Migraines 07/01/2016  . Obese 07/01/2016    Past Surgical History:  Procedure Laterality Date  . APPENDECTOMY    . TONSILLECTOMY  1986    OB History   No obstetric history on file.      Home Medications    Prior to Admission medications   Medication Sig Start Date End Date Taking? Authorizing Provider  ALPRAZolam Prudy Feeler) 0.5 MG tablet Take 1 tablet (0.5 mg total) by mouth at bedtime. Need office visit for more refills. 09/22/19   Pincus Sanes, MD  amLODipine (NORVASC) 5 MG tablet Take 1 tablet (5 mg total) by mouth daily. 08/10/19   Pincus Sanes, MD  fluticasone (FLONASE) 50 MCG/ACT nasal spray Place 2 sprays into both nostrils daily. 08/10/19   Pincus Sanes, MD  loratadine (CLARITIN) 10 MG tablet Take 1 tablet (10 mg total) by mouth daily. 08/10/19   Pincus Sanes, MD  losartan-hydrochlorothiazide (HYZAAR) 100-25 MG tablet Take 1 tablet by mouth  daily. 08/10/19   Pincus Sanes, MD  Multiple Vitamin (MULTIVITAMIN WITH MINERALS) TABS tablet Take 1 tablet by mouth daily.    [provider]  ondansetron (ZOFRAN ODT) 4 MG disintegrating tablet Take 1 tablet (4 mg total) by mouth every 8 (eight) hours as needed for nausea or vomiting. 08/10/19   Burns, Bobette Mo, MD  potassium chloride SA (KLOR-CON) 20 MEQ tablet Take 1 tablet (20 mEq total) by mouth 2 (two) times daily. 12/27/19   Pincus Sanes, MD  triamcinolone cream (KENALOG) 0.1 % Apply twice a day to areas with eczema prn Patient taking differently: Apply 1 application topically 2 (two) times daily as needed (eczema).  04/01/19   Pincus Sanes, MD  venlafaxine XR (EFFEXOR-XR) 75 MG 24 hr capsule TAKE 1 CAPSULE BY MOUTH DAILY WITH BREAKFAST. Follow up due in October 12/27/19   Pincus Sanes, MD    Family History Family History  Problem Relation Age of Onset  . Arthritis Mother   . Diabetes Mother   . Depression Mother   . Arthritis Father   . Heart disease Father   . Hypertension Father   . Arthritis Maternal Grandmother   . Diabetes Maternal Grandfather   . Arthritis Paternal Grandmother   . Depression Brother     Social History Social History   Tobacco Use  . Smoking status: Former Smoker    Packs/day:  0.25    Years: 16.00    Pack years: 4.00    Start date: 10    Quit date: 1994    Years since quitting: 27.7  . Smokeless tobacco: Never Used  Substance Use Topics  . Alcohol use: Yes    Alcohol/week: 7.0 standard drinks    Types: 7 Glasses of wine per week  . Drug use: Yes    Types: Marijuana     Allergies   Beef (bovine) protein   Review of Systems As per HPI   Physical Exam Triage Vital Signs ED Triage Vitals  Enc Vitals Group     BP 01/08/20 1344 (!) 164/108     Pulse Rate 01/08/20 1344 80     Resp 01/08/20 1344 18     Temp 01/08/20 1344 98.5 F (36.9 C)     Temp Source 01/08/20 1344 Oral     SpO2 01/08/20 1344 95 %     Weight --       Height --      Head Circumference --      Peak Flow --      Pain Score 01/08/20 1345 7     Pain Loc --      Pain Edu? --      Excl. in GC? --    No data found.  Updated Vital Signs BP (!) 164/108 (BP Location: Left Arm)   Pulse 80   Temp 98.5 F (36.9 C) (Oral)   Resp 18   SpO2 95%   Visual Acuity Right Eye Distance:   Left Eye Distance:   Bilateral Distance:    Right Eye Near:   Left Eye Near:    Bilateral Near:     Physical Exam Constitutional:      General: She is not in acute distress. HENT:     Head: Normocephalic and atraumatic.     Jaw: There is normal jaw occlusion. No tenderness or pain on movement.     Right Ear: Hearing, tympanic membrane, ear canal and external ear normal. No tenderness. No mastoid tenderness.     Left Ear: Hearing, tympanic membrane, ear canal and external ear normal. No tenderness. No mastoid tenderness.     Nose: No nasal deformity, septal deviation or nasal tenderness.     Right Turbinates: Not swollen or pale.     Left Turbinates: Not swollen or pale.     Right Sinus: No maxillary sinus tenderness or frontal sinus tenderness.     Left Sinus: No maxillary sinus tenderness or frontal sinus tenderness.     Mouth/Throat:     Lips: Pink. No lesions.     Mouth: Mucous membranes are moist. No injury.     Pharynx: Oropharynx is clear. Uvula midline. No posterior oropharyngeal erythema or uvula swelling.     Comments: no tonsillar exudate or hypertrophy Cardiovascular:     Rate and Rhythm: Normal rate.  Pulmonary:     Effort: Pulmonary effort is normal.  Musculoskeletal:     Cervical back: Normal range of motion and neck supple. No muscular tenderness.  Lymphadenopathy:     Cervical: No cervical adenopathy.  Neurological:     Mental Status: She is alert and oriented to person, place, and time.      UC Treatments / Results  Labs (all labs ordered are listed, but only abnormal results are displayed) Labs Reviewed  NOVEL  CORONAVIRUS, NAA    EKG   Radiology No results found.  Procedures Procedures (including critical  care time)  Medications Ordered in UC Medications - No data to display  Initial Impression / Assessment and Plan / UC Course  I have reviewed the triage vital signs and the nursing notes.  Pertinent labs & imaging results that were available during my care of the patient were reviewed by me and considered in my medical decision making (see chart for details).     Patient afebrile, nontoxic, with SpO2 95%.  Covid PCR pending.  Patient to quarantine until results are back.  We will treat supportively as outlined below.  Return precautions discussed, patient verbalized understanding and is agreeable to plan. Final Clinical Impressions(s) / UC Diagnoses   Final diagnoses:  Encounter for screening for COVID-19  Viral illness     Discharge Instructions     Your COVID test is pending - it is important to quarantine / isolate at home until your results are back. If you test positive and would like further evaluation for persistent or worsening symptoms, you may schedule an E-visit or virtual (video) visit throughout the [facility] app or website.  PLEASE NOTE: If you develop severe chest pain or shortness of breath please go to the ER or call 9-1-1 for further evaluation --> DO NOT schedule electronic or virtual visits for this. Please call our office for further guidance / recommendations as needed.  For information about the Covid vaccine, please visit [email or web address]    ED Prescriptions    None     PDMP not reviewed this encounter.   Hall[place]Potvin, [place], [place] 01/08/20 1438

## 2020-01-08 NOTE — ED Triage Notes (Signed)
Pt here for nasal congestion, sore throat, nausea and diarrhea x 3 days

## 2020-01-09 ENCOUNTER — Other Ambulatory Visit: Payer: Self-pay | Admitting: Internal Medicine

## 2020-01-09 LAB — NOVEL CORONAVIRUS, NAA: SARS-CoV-2, NAA: NOT DETECTED

## 2020-01-09 LAB — SARS-COV-2, NAA 2 DAY TAT

## 2020-01-09 MED ORDER — ONDANSETRON 4 MG PO TBDP: 4.0000 mg | ORAL_TABLET | Freq: Three times a day (TID) | ORAL | 0 refills | Status: DC | PRN

## 2020-02-20 ENCOUNTER — Encounter: Payer: Self-pay | Admitting: Internal Medicine

## 2020-02-20 DIAGNOSIS — Z1211 Encounter for screening for malignant neoplasm of colon: Secondary | ICD-10-CM

## 2020-02-20 DIAGNOSIS — Z1283 Encounter for screening for malignant neoplasm of skin: Secondary | ICD-10-CM

## 2020-03-12 ENCOUNTER — Other Ambulatory Visit: Payer: Self-pay | Admitting: Internal Medicine

## 2020-03-12 MED ORDER — VENLAFAXINE HCL ER 75 MG PO CP24: ORAL_CAPSULE | ORAL | 0 refills | Status: DC

## 2020-03-13 ENCOUNTER — Other Ambulatory Visit: Payer: Self-pay | Admitting: Internal Medicine

## 2020-03-21 ENCOUNTER — Other Ambulatory Visit: Payer: Self-pay | Admitting: Internal Medicine

## 2020-03-24 ENCOUNTER — Other Ambulatory Visit: Payer: Self-pay | Admitting: Internal Medicine

## 2020-03-30 ENCOUNTER — Ambulatory Visit: Payer: Commercial Managed Care - PPO | Attending: Internal Medicine

## 2020-03-30 DIAGNOSIS — Z23 Encounter for immunization: Secondary | ICD-10-CM

## 2020-03-30 NOTE — Progress Notes (Signed)
   Covid-19 Vaccination Clinic  Name:  Andrea Oneal    MRN: [id number] DOB: [birth year]  03/30/2020  [name] was observed post Covid-19 immunization for 15 minutes without incident. She was provided with Vaccine Information Sheet and instruction to access the V-Safe system.   [name] was instructed to call 911 with any severe reactions post vaccine: Marland Kitchen Difficulty breathing  . Swelling of face and throat  . A fast heartbeat  . A bad rash all over body  . Dizziness and weakness   Immunizations Administered    Name Date Dose VIS Date Route   Pfizer COVID-19 Vaccine 03/30/2020  4:43 PM 0.3 mL 02/15/2020 Intramuscular   Manufacturer: [employer], [employer]   Lot: [id number]   NDC: [address]-6

## 2020-04-24 ENCOUNTER — Other Ambulatory Visit: Payer: Self-pay | Admitting: *Deleted

## 2020-04-24 DIAGNOSIS — Z1231 Encounter for screening mammogram for malignant neoplasm of breast: Secondary | ICD-10-CM

## 2020-05-29 ENCOUNTER — Encounter: Payer: Self-pay | Admitting: Internal Medicine

## 2020-06-04 ENCOUNTER — Encounter: Payer: Self-pay | Admitting: Internal Medicine

## 2020-06-05 ENCOUNTER — Other Ambulatory Visit: Payer: Self-pay | Admitting: Internal Medicine

## 2020-06-05 DIAGNOSIS — Z1231 Encounter for screening mammogram for malignant neoplasm of breast: Secondary | ICD-10-CM

## 2020-06-26 ENCOUNTER — Other Ambulatory Visit: Payer: Self-pay | Admitting: Internal Medicine

## 2020-07-02 ENCOUNTER — Other Ambulatory Visit: Payer: Self-pay | Admitting: Internal Medicine

## 2020-07-03 ENCOUNTER — Encounter: Payer: Self-pay | Admitting: Internal Medicine

## 2020-07-03 MED ORDER — ALPRAZOLAM 0.5 MG PO TABS: 0.5000 mg | ORAL_TABLET | Freq: Every day | ORAL | 0 refills | Status: DC

## 2020-07-06 DIAGNOSIS — Z1231 Encounter for screening mammogram for malignant neoplasm of breast: Secondary | ICD-10-CM

## 2020-07-23 ENCOUNTER — Other Ambulatory Visit: Payer: Self-pay

## 2020-07-23 ENCOUNTER — Other Ambulatory Visit: Payer: Self-pay | Admitting: Internal Medicine

## 2020-07-23 ENCOUNTER — Encounter: Payer: Self-pay | Admitting: Dermatology

## 2020-07-23 ENCOUNTER — Ambulatory Visit: Payer: Commercial Managed Care - PPO | Admitting: Dermatology

## 2020-07-23 DIAGNOSIS — L570 Actinic keratosis: Secondary | ICD-10-CM

## 2020-07-23 DIAGNOSIS — L309 Dermatitis, unspecified: Secondary | ICD-10-CM | POA: Diagnosis not present

## 2020-07-23 DIAGNOSIS — Z1283 Encounter for screening for malignant neoplasm of skin: Secondary | ICD-10-CM | POA: Diagnosis not present

## 2020-07-23 DIAGNOSIS — B079 Viral wart, unspecified: Secondary | ICD-10-CM | POA: Diagnosis not present

## 2020-07-23 DIAGNOSIS — L821 Other seborrheic keratosis: Secondary | ICD-10-CM

## 2020-07-23 MED ORDER — TRIAMCINOLONE ACETONIDE 0.1 % EX CREA: 1.0000 "application " | TOPICAL_CREAM | Freq: Two times a day (BID) | CUTANEOUS | 1 refills | Status: DC | PRN

## 2020-07-26 NOTE — Progress Notes (Addendum)
Subjective:    Patient ID: Andrea Oneal, female    DOB: 07/20/1960, 60 y.o.   MRN: [id number]   This visit occurred during the SARS-CoV-2 public health emergency.  Safety protocols were in place, including screening questions prior to the visit, additional usage of staff PPE, and extensive cleaning of exam room while observing appropriate contact time as indicated for disinfecting solutions.    HPI She is here for a physical exam.   Her major concern is working on her weight and her energy level.  She is working on it.  She is exercising and has lost a little weight.  Her energy level is also better.  She is drinking less alcohol and she thinks that is helping as well.    Medications and allergies reviewed with patient and updated if appropriate.  Patient Active Problem List   Diagnosis Date Noted  . Vertigo 08/10/2019  . Hypertension 04/01/2019  . Eczema 03/05/2018  . Hyperglycemia 03/05/2018  . Internal hemorrhoid 03/18/2017  . IBS (irritable bowel syndrome) 03/18/2017  . Anxiety 07/01/2016  . Depression 07/01/2016  . Allergic rhinitis 07/01/2016  . Migraines 07/01/2016  . Obese 07/01/2016    Current Outpatient Medications on File Prior to Visit  Medication Sig Dispense Refill  . ALPRAZolam (XANAX) 0.5 MG tablet Take 1 tablet (0.5 mg total) by mouth at bedtime. Need office visit for more refills. 15 tablet 0  . amLODipine (NORVASC) 5 MG tablet Take 1 tablet (5 mg total) by mouth daily. 90 tablet 3  . KLOR-CON M20 20 MEQ tablet TAKE 1 TABLET BY MOUTH TWICE A DAY 180 tablet 0  . loratadine (CLARITIN) 10 MG tablet Take 1 tablet (10 mg total) by mouth daily. 30 tablet 11  . losartan-hydrochlorothiazide (HYZAAR) 100-25 MG tablet Take 1 tablet by mouth daily. 90 tablet 3  . Multiple Vitamin (MULTIVITAMIN WITH MINERALS) TABS tablet Take 1 tablet by mouth daily.    . ondansetron (ZOFRAN) 4 MG tablet Take 4 mg by mouth as needed for nausea or vomiting.    . triamcinolone  (KENALOG) 0.1 % Apply 1 application topically 2 (two) times daily as needed (eczema). 454 g 1   No current facility-administered medications on file prior to visit.    Past Medical History:  Diagnosis Date  . Arthritis   . Depression   . Hypertension   . Migraine     Past Surgical History:  Procedure Laterality Date  . APPENDECTOMY    . TONSILLECTOMY  1986    Social History   Socioeconomic History  . Marital status: Divorced    Spouse name: Not on file  . Number of children: Not on file  . Years of education: Not on file  . Highest education level: Not on file  Occupational History  . Not on file  Tobacco Use  . Smoking status: Former Smoker    Packs/day: 0.25    Years: 16.00    Pack years: 4.00    Start date: 36    Quit date: 1994    Years since quitting: 28.2  . Smokeless tobacco: Never Used  Substance and Sexual Activity  . Alcohol use: Yes    Alcohol/week: 7.0 standard drinks    Types: 7 Glasses of wine per week  . Drug use: Yes    Types: Marijuana  . Sexual activity: Not on file  Other Topics Concern  . Not on file  Social History Narrative   Has boyfriend, his daughter just moved in -  16   Social Determinants of Corporate investment banker Strain: Not on file  Food Insecurity: Not on file  Transportation Needs: Not on file  Physical Activity: Not on file  Stress: Not on file  Social Connections: Not on file    Family History  Problem Relation Age of Onset  . Arthritis Mother   . Diabetes Mother   . Depression Mother   . Arthritis Father   . Heart disease Father   . Hypertension Father   . Arthritis Maternal Grandmother   . Diabetes Maternal Grandfather   . Arthritis Paternal Grandmother   . Depression Brother     Review of Systems  Constitutional: Negative for chills and fever.  Eyes: Negative for visual disturbance.  Respiratory: Negative for cough, shortness of breath and wheezing.   Cardiovascular: Negative for chest pain,  palpitations and leg swelling.  Gastrointestinal: Negative for abdominal pain, blood in stool, constipation, diarrhea and nausea.       No gerd  Genitourinary: Negative for dysuria and hematuria.  Musculoskeletal: Positive for arthralgias (mild).  Skin: Negative for rash.  Neurological: Negative for light-headedness and headaches.  Psychiatric/Behavioral: Positive for dysphoric mood. The patient is nervous/anxious.        Objective:   Vitals:   07/27/20 1001  BP: 140/80  Pulse: 73  Temp: 97.8 F (36.6 C)  SpO2: 97%   Filed Weights   07/27/20 1001  Weight: 198 lb (89.8 kg)   Body mass index is 31.96 kg/m.  BP Readings from Last 3 Encounters:  07/27/20 140/80  01/08/20 (!) 164/108  08/10/19 (!) 148/94    Wt Readings from Last 3 Encounters:  07/27/20 198 lb (89.8 kg)  08/10/19 200 lb (90.7 kg)  08/08/19 198 lb (89.8 kg)    Depression screen [facility] 2/9 07/27/2020 05/03/2019  Decreased Interest 1 0  Down, Depressed, Hopeless 1 0  PHQ - 2 Score 2 0  Altered sleeping 1 1  Tired, decreased energy 1 0  Change in appetite 0 0  Feeling bad or failure about yourself  0 0  Trouble concentrating 0 0  Moving slowly or fidgety/restless 0 0  Suicidal thoughts 0 0  PHQ-9 Score 4 1  Difficult doing work/chores Not difficult at all -    GAD 7 : Generalized Anxiety Score 07/27/2020 05/03/2019  Nervous, Anxious, on Edge 1 1  Control/stop worrying 1 0  Worry too much - different things 1 0  Trouble relaxing 1 1  Restless 1 0  Easily annoyed or irritable 0 1  Afraid - awful might happen 0 0  Total GAD 7 Score 5 3  Anxiety Difficulty Not difficult at all -       Physical Exam Constitutional: She appears well-developed and well-nourished. No distress.  HENT:  Head: Normocephalic and atraumatic.  Right Ear: External ear normal. Normal ear canal and TM Left Ear: External ear normal.  Normal ear canal and TM Mouth/Throat: Oropharynx is clear and moist.  Eyes: Conjunctivae and EOM  are normal.  Neck: Neck supple. No tracheal deviation present. No thyromegaly present.  Probable thyroid nodule central- right side/isthmus No carotid bruit  Cardiovascular: Normal rate, regular rhythm and normal heart sounds.   No murmur heard.  No edema. Pulmonary/Chest: Effort normal and breath sounds normal. No respiratory distress. She has no wheezes. She has no rales.  Breast: deferred   Abdominal: Soft. She exhibits no distension. There is no tenderness.  Lymphadenopathy: She has no cervical adenopathy.  Skin: Skin  is warm and dry. She is not diaphoretic.  Psychiatric: She has a normal mood and affect. Her behavior is normal.        Assessment & Plan:   Physical exam: Screening blood work    ordered Immunizations  discussed flu vac, covid booster,  Colonoscopy  Last 2014 in [place] - 10 years Mammogram  Up to date  - had one today Gyn  Up to date  Eye exams  Up to date Exercise  Walking. Will start with weights.  Weight  Advised weight loss Substance abuse  none Saw derm      See Problem List for Assessment and Plan of chronic medical problems.

## 2020-07-26 NOTE — Patient Instructions (Addendum)
Blood work was ordered.     Medications changes include :   effexor - 37.5 mg - taper off slowly.  We will start celexa when you are ready.   Your prescription(s) have been submitted to your pharmacy. Please take as directed and contact our office if you believe you are having problem(s) with the medication(s).    Please followup in 6 months     Health Maintenance, Female Adopting a healthy lifestyle and getting preventive care are important in promoting health and wellness. Ask your health care provider about:  The right schedule for you to have regular tests and exams.  Things you can do on your own to prevent diseases and keep yourself healthy. What should I know about diet, weight, and exercise? Eat a healthy diet  Eat a diet that includes plenty of vegetables, fruits, low-fat dairy products, and lean protein.  Do not eat a lot of foods that are high in solid fats, added sugars, or sodium.   Maintain a healthy weight Body mass index (BMI) is used to identify weight problems. It estimates body fat based on height and weight. Your health care provider can help determine your BMI and help you achieve or maintain a healthy weight. Get regular exercise Get regular exercise. This is one of the most important things you can do for your health. Most adults should:  Exercise for at least 150 minutes each week. The exercise should increase your heart rate and make you sweat (moderate-intensity exercise).  Do strengthening exercises at least twice a week. This is in addition to the moderate-intensity exercise.  Spend less time sitting. Even light physical activity can be beneficial. Watch cholesterol and blood lipids Have your blood tested for lipids and cholesterol at 59 years of age, then have this test every 5 years. Have your cholesterol levels checked more often if:  Your lipid or cholesterol levels are high.  You are older than 59 years of age.  You are at high risk for  heart disease. What should I know about cancer screening? Depending on your health history and family history, you may need to have cancer screening at various ages. This may include screening for:  Breast cancer.  Cervical cancer.  Colorectal cancer.  Skin cancer.  Lung cancer. What should I know about heart disease, diabetes, and high blood pressure? Blood pressure and heart disease  High blood pressure causes heart disease and increases the risk of stroke. This is more likely to develop in people who have high blood pressure readings, are of African descent, or are overweight.  Have your blood pressure checked: ? Every 3-5 years if you are 64-58 years of age. ? Every year if you are 30 years old or older. Diabetes Have regular diabetes screenings. This checks your fasting blood sugar level. Have the screening done:  Once every three years after age 37 if you are at a normal weight and have a low risk for diabetes.  More often and at a younger age if you are overweight or have a high risk for diabetes. What should I know about preventing infection? Hepatitis B If you have a higher risk for hepatitis B, you should be screened for this virus. Talk with your health care provider to find out if you are at risk for hepatitis B infection. Hepatitis C Testing is recommended for:  Everyone born from [birth year] through 1965.  Anyone with known risk factors for hepatitis C. Sexually transmitted infections (STIs)  Get screened for  STIs, including gonorrhea and chlamydia, if: ? You are sexually active and are younger than 59 years of age. ? You are older than 59 years of age and your health care provider tells you that you are at risk for this type of infection. ? Your sexual activity has changed since you were last screened, and you are at increased risk for chlamydia or gonorrhea. Ask your health care provider if you are at risk.  Ask your health care provider about whether you are at  high risk for HIV. Your health care provider may recommend a prescription medicine to help prevent HIV infection. If you choose to take medicine to prevent HIV, you should first get tested for HIV. You should then be tested every 3 months for as long as you are taking the medicine. Pregnancy  If you are about to stop having your period (premenopausal) and you may become pregnant, seek counseling before you get pregnant.  Take 400 to 800 micrograms (mcg) of folic acid every day if you become pregnant.  Ask for birth control (contraception) if you want to prevent pregnancy. Osteoporosis and menopause Osteoporosis is a disease in which the bones lose minerals and strength with aging. This can result in bone fractures. If you are 90 years old or older, or if you are at risk for osteoporosis and fractures, ask your health care provider if you should:  Be screened for bone loss.  Take a calcium or vitamin D supplement to lower your risk of fractures.  Be given hormone replacement therapy (HRT) to treat symptoms of menopause. Follow these instructions at home: Lifestyle  Do not use any products that contain nicotine or tobacco, such as cigarettes, e-cigarettes, and chewing tobacco. If you need help quitting, ask your health care provider.  Do not use street drugs.  Do not share needles.  Ask your health care provider for help if you need support or information about quitting drugs. Alcohol use  Do not drink alcohol if: ? Your health care provider tells you not to drink. ? You are pregnant, may be pregnant, or are planning to become pregnant.  If you drink alcohol: ? Limit how much you use to 0-1 drink a day. ? Limit intake if you are breastfeeding.  Be aware of how much alcohol is in your drink. In the [place] one drink equals one 12 oz bottle of beer (355 mL), one 5 oz glass of wine (148 mL), or one 1 oz glass of hard liquor (44 mL). General instructions  Schedule regular health,  dental, and eye exams.  Stay current with your vaccines.  Tell your health care provider if: ? You often feel depressed. ? You have ever been abused or do not feel safe at home. Summary  Adopting a healthy lifestyle and getting preventive care are important in promoting health and wellness.  Follow your health care provider's instructions about healthy diet, exercising, and getting tested or screened for diseases.  Follow your health care provider's instructions on monitoring your cholesterol and blood pressure. This information is not intended to replace advice given to you by your health care provider. Make sure you discuss any questions you have with your health care provider. Document Revised: 04/07/2018 Document Reviewed: 04/07/2018 Elsevier Patient Education  2021 [employer].

## 2020-07-27 ENCOUNTER — Encounter: Payer: Self-pay | Admitting: Internal Medicine

## 2020-07-27 ENCOUNTER — Other Ambulatory Visit: Payer: Self-pay

## 2020-07-27 ENCOUNTER — Ambulatory Visit (INDEPENDENT_AMBULATORY_CARE_PROVIDER_SITE_OTHER): Payer: Commercial Managed Care - PPO | Admitting: Internal Medicine

## 2020-07-27 ENCOUNTER — Ambulatory Visit
Admission: RE | Admit: 2020-07-27 | Discharge: 2020-07-27 | Disposition: A | Discharge status: Home / Self Care | Payer: Commercial Managed Care - PPO | Source: Ambulatory Visit | Attending: Internal Medicine | Admitting: Internal Medicine

## 2020-07-27 VITALS — BP 140/80 | HR 73 | Temp 97.8°F | Ht 66.0 in | Wt 198.0 lb

## 2020-07-27 DIAGNOSIS — R739 Hyperglycemia, unspecified: Secondary | ICD-10-CM

## 2020-07-27 DIAGNOSIS — E6609 Other obesity due to excess calories: Secondary | ICD-10-CM

## 2020-07-27 DIAGNOSIS — Z6831 Body mass index (BMI) 31.0-31.9, adult: Secondary | ICD-10-CM

## 2020-07-27 DIAGNOSIS — Z1231 Encounter for screening mammogram for malignant neoplasm of breast: Secondary | ICD-10-CM

## 2020-07-27 DIAGNOSIS — Z0001 Encounter for general adult medical examination with abnormal findings: Secondary | ICD-10-CM | POA: Diagnosis not present

## 2020-07-27 DIAGNOSIS — I10 Essential (primary) hypertension: Secondary | ICD-10-CM

## 2020-07-27 DIAGNOSIS — Z0000 Encounter for general adult medical examination without abnormal findings: Secondary | ICD-10-CM

## 2020-07-27 DIAGNOSIS — F419 Anxiety disorder, unspecified: Secondary | ICD-10-CM | POA: Diagnosis not present

## 2020-07-27 DIAGNOSIS — E041 Nontoxic single thyroid nodule: Secondary | ICD-10-CM

## 2020-07-27 DIAGNOSIS — F3289 Other specified depressive episodes: Secondary | ICD-10-CM

## 2020-07-27 LAB — COMPREHENSIVE METABOLIC PANEL
ALT: 34 U/L (ref 0–35)
AST: 26 U/L (ref 0–37)
Albumin: 4.5 g/dL (ref 3.5–5.2)
Alkaline Phosphatase: 73 U/L (ref 39–117)
BUN: 10 mg/dL (ref 6–23)
CO2: 30 mEq/L (ref 19–32)
Calcium: 9.5 mg/dL (ref 8.4–10.5)
Chloride: 101 mEq/L (ref 96–112)
Creatinine, Ser: 0.72 mg/dL (ref 0.40–1.20)
GFR: 91.66 mL/min (ref >60.00)
Glucose, Bld: 102 mg/dL — ABNORMAL HIGH (ref 70–99)
Potassium: 4.2 mEq/L (ref 3.5–5.1)
Sodium: 138 mEq/L (ref 135–145)
Total Bilirubin: 0.7 mg/dL (ref 0.2–1.2)
Total Protein: 7.5 g/dL (ref 6.0–8.3)

## 2020-07-27 LAB — CBC WITH DIFFERENTIAL/PLATELET
Basophils Absolute: 0.1 10*3/uL (ref 0.0–0.1)
Basophils Relative: 1.2 % (ref 0.0–3.0)
Eosinophils Absolute: 0.3 10*3/uL (ref 0.0–0.7)
Eosinophils Relative: 4.9 % (ref 0.0–5.0)
HCT: 41.2 % (ref 36.0–46.0)
Hemoglobin: 13.7 g/dL (ref 12.0–15.0)
Lymphocytes Relative: 31.4 % (ref 12.0–46.0)
Lymphs Abs: 2 10*3/uL (ref 0.7–4.0)
MCHC: 33.3 g/dL (ref 30.0–36.0)
MCV: 90.7 fl (ref 78.0–100.0)
Monocytes Absolute: 0.4 10*3/uL (ref 0.1–1.0)
Monocytes Relative: 6.7 % (ref 3.0–12.0)
Neutro Abs: 3.6 10*3/uL (ref 1.4–7.7)
Neutrophils Relative %: 55.8 % (ref 43.0–77.0)
Platelets: 286 10*3/uL (ref 150.0–400.0)
RBC: 4.55 Mil/uL (ref 3.87–5.11)
RDW: 12.8 % (ref 11.5–15.5)
WBC: 6.5 10*3/uL (ref 4.0–10.5)

## 2020-07-27 LAB — LIPID PANEL
Cholesterol: 215 mg/dL — ABNORMAL HIGH (ref 0–200)
HDL: 51.1 mg/dL (ref >39.00)
LDL Cholesterol: 135 mg/dL — ABNORMAL HIGH (ref 0–99)
NonHDL: 164.18
Total CHOL/HDL Ratio: 4
Triglycerides: 147 mg/dL (ref 0.0–149.0)
VLDL: 29.4 mg/dL (ref 0.0–40.0)

## 2020-07-27 LAB — TSH: TSH: 2.72 u[IU]/mL (ref 0.35–4.50)

## 2020-07-27 LAB — HEMOGLOBIN A1C: Hgb A1c MFr Bld: 5.5 % (ref 4.6–6.5)

## 2020-07-27 MED ORDER — VENLAFAXINE HCL ER 37.5 MG PO CP24: 37.5000 mg | ORAL_CAPSULE | Freq: Every day | ORAL | 0 refills | Status: DC

## 2020-07-27 NOTE — Assessment & Plan Note (Signed)
Chronic Not ideally controlled, but her current medication does help On occasion she admits she has thoughts of wondering what it would be like if she drove her car off the road or was not here anymore and she thinks it could be related to the Effexor.  She would like to go back to the citalopram Taper off Effexor Start citalopram 20 mg daily once off the Effexor

## 2020-07-27 NOTE — Assessment & Plan Note (Signed)
Chronic Sugars have been in the normal range We will check A1c Continue regular exercise and weight loss efforts

## 2020-07-27 NOTE — Assessment & Plan Note (Signed)
New On exam  [place] of thyroid ordered tsh

## 2020-07-27 NOTE — Addendum Note (Signed)
Addended by: Pincus Sanes on: 07/27/2020 01:22 PM   Modules accepted: Orders

## 2020-07-27 NOTE — Assessment & Plan Note (Signed)
Chronic Blood pressure much better controlled BP still borderline high here today, but I think it likely is better controlled at home We will continue current medications amlodipine 5 mg daily, losartan-hydrochlorothiazide 100-25 mg daily Continue regular exercise, weight loss efforts and healthy eating CMP

## 2020-07-27 NOTE — Assessment & Plan Note (Signed)
Chronic She is actively working on weight loss She is exercising regularly hiking walking She will try to add weights into her routine Healthy diet, decrease portions

## 2020-07-27 NOTE — Assessment & Plan Note (Signed)
Chronic Not ideally controlled, but her current medication does help On occasion she admits she has thoughts of wondering what it would be like if she drove her car off the road or was not here anymore and she thinks it could be related to the Effexor.  She would like to go back to the citalopram Taper off Effexor Continue alprazolam 0.5 mg daily as needed-she does not take this often Start citalopram 20 mg daily once off the Effexor

## 2020-07-31 ENCOUNTER — Ambulatory Visit
Admission: RE | Admit: 2020-07-31 | Discharge: 2020-07-31 | Disposition: A | Discharge status: Home / Self Care | Payer: Commercial Managed Care - PPO | Source: Ambulatory Visit | Attending: Internal Medicine | Admitting: Internal Medicine

## 2020-07-31 DIAGNOSIS — E041 Nontoxic single thyroid nodule: Secondary | ICD-10-CM

## 2020-08-03 ENCOUNTER — Encounter: Payer: Self-pay | Admitting: Internal Medicine

## 2020-08-03 ENCOUNTER — Encounter: Payer: Self-pay | Admitting: Dermatology

## 2020-08-03 NOTE — Progress Notes (Signed)
   New Patient   Subjective  Andrea Oneal is a 60 y.o. female who presents for the following: Annual Exam (Full body skin check. Mole on left side of back x year.).  General skin examination Location:  Duration: Newer spot on left lower back and crust on right cheek Quality:  Associated Signs/Symptoms: Modifying Factors:  Severity:  Timing: Context:    The following portions of the chart were reviewed this encounter and updated as appropriate:  Tobacco  Allergies  Meds  Problems  Med Hx  Surg Hx  Fam Hx      Objective  Well appearing patient in no apparent distress; mood and affect are within normal limits. Objective  Left Abdomen (side) - Lower: Full body skin check. No atypical moles.  Objective  Left Lower Back: multiple 3 to 34mm brown flattopped papules; dermoscopy typical.  Objective  Right third knuckle: Right third knuckle four millimeter verrucous papule  Objective  Right Buccal Cheek: 4 mm flat pink hornlike crust  Objective  Right Forearm - Anterior: Nonspecific patches of chronic dermatitis without margination.  This is not a recent rash so I suspect an endogenous adult eczema is more likely than contact dermatitis.   A full examination was performed including scalp, head, eyes, ears, nose, lips, neck, chest, axillae, abdomen, back, buttocks, bilateral upper extremities, bilateral lower extremities, hands, feet, fingers, toes, fingernails, and toenails. All findings within normal limits unless otherwise noted below.  Areas beneath undergarments not fully examined.   Assessment & Plan  Screening exam for skin cancer Left Abdomen (side) - Lower  Yearly skin check, encouraged to self examine twice annually.  Continued ultraviolet protection.  Seborrheic keratosis Left Lower Back  Okay to leave if stable.  Viral warts, unspecified type Right third knuckle  Discussed treatment options and the lack of a proven antiviral.  No intervention  for now.  Solar keratosis Right Buccal Cheek    Destruction of lesion - Right Buccal Cheek Complexity: simple   Destruction method: cryotherapy   Informed consent: discussed and consent obtained   Timeout:  patient name, date of birth, surgical site, and procedure verified Lesion destroyed using liquid nitrogen: Yes   Cryotherapy cycles:  5 Outcome: patient tolerated procedure well with no complications   Post-procedure details: wound care instructions given    Eczema, unspecified type Right Forearm - Anterior  Topical triamcinolone daily to involved areas for a maximum of 4 weeks.  Avoid using this on body folds and face.  Follow-up by MyChart or phone at that time.  triamcinolone (KENALOG) 0.1 % - Right Forearm - Anterior

## 2020-08-06 MED ORDER — LOSARTAN POTASSIUM-HCTZ 100-25 MG PO TABS: 1.0000 | ORAL_TABLET | Freq: Every day | ORAL | 1 refills | Status: DC

## 2020-08-21 ENCOUNTER — Other Ambulatory Visit: Payer: Self-pay | Admitting: Internal Medicine

## 2020-10-02 ENCOUNTER — Other Ambulatory Visit: Payer: Self-pay | Admitting: Internal Medicine

## 2020-10-09 ENCOUNTER — Encounter: Payer: Self-pay | Admitting: Internal Medicine

## 2020-10-10 MED ORDER — SUMATRIPTAN SUCCINATE 50 MG PO TABS: 50.0000 mg | ORAL_TABLET | Freq: Once | ORAL | 5 refills | Status: AC | PRN

## 2020-10-10 NOTE — Addendum Note (Signed)
Addended by: Pincus Sanes on: 10/10/2020 01:01 PM   Modules accepted: Orders

## 2020-11-02 ENCOUNTER — Encounter: Payer: Self-pay | Admitting: Internal Medicine

## 2020-11-10 ENCOUNTER — Other Ambulatory Visit: Payer: Self-pay | Admitting: Internal Medicine

## 2020-11-16 ENCOUNTER — Encounter: Payer: Self-pay | Admitting: Internal Medicine

## 2020-11-16 ENCOUNTER — Other Ambulatory Visit: Payer: Self-pay | Admitting: Internal Medicine

## 2020-11-18 MED ORDER — LOSARTAN POTASSIUM-HCTZ 100-25 MG PO TABS: 1.0000 | ORAL_TABLET | Freq: Every day | ORAL | 1 refills | Status: DC

## 2020-11-18 MED ORDER — AMLODIPINE BESYLATE 5 MG PO TABS: 5.0000 mg | ORAL_TABLET | Freq: Every day | ORAL | 0 refills | Status: DC

## 2020-11-29 ENCOUNTER — Other Ambulatory Visit: Payer: Self-pay | Admitting: Internal Medicine

## 2020-12-09 ENCOUNTER — Other Ambulatory Visit: Payer: Self-pay | Admitting: Internal Medicine

## 2021-01-01 ENCOUNTER — Other Ambulatory Visit: Payer: Self-pay | Admitting: Internal Medicine

## 2021-01-08 ENCOUNTER — Encounter: Payer: Self-pay | Admitting: Internal Medicine

## 2021-01-09 MED ORDER — VENLAFAXINE HCL ER 75 MG PO CP24: 75.0000 mg | ORAL_CAPSULE | Freq: Every day | ORAL | 1 refills | Status: DC

## 2021-01-09 MED ORDER — VENLAFAXINE HCL ER 37.5 MG PO CP24: 37.5000 mg | ORAL_CAPSULE | Freq: Every day | ORAL | 1 refills | Status: DC

## 2021-01-09 NOTE — Addendum Note (Signed)
Addended by: Pincus Sanes on: 01/09/2021 08:24 PM   Modules accepted: Orders

## 2021-02-14 ENCOUNTER — Other Ambulatory Visit: Payer: Self-pay | Admitting: Internal Medicine

## 2021-02-19 ENCOUNTER — Other Ambulatory Visit: Payer: Self-pay | Admitting: Internal Medicine

## 2021-04-11 ENCOUNTER — Other Ambulatory Visit: Payer: Self-pay | Admitting: Internal Medicine

## 2021-05-06 ENCOUNTER — Other Ambulatory Visit: Payer: Self-pay | Admitting: Internal Medicine

## 2021-05-16 ENCOUNTER — Other Ambulatory Visit: Payer: Self-pay | Admitting: Internal Medicine

## 2021-05-27 ENCOUNTER — Other Ambulatory Visit: Payer: Self-pay | Admitting: Internal Medicine

## 2021-05-30 ENCOUNTER — Telehealth: Payer: Commercial Managed Care - PPO | Admitting: Physician Assistant

## 2021-05-30 DIAGNOSIS — U071 COVID-19: Secondary | ICD-10-CM

## 2021-05-30 MED ORDER — ONDANSETRON 4 MG PO TBDP: 4.0000 mg | ORAL_TABLET | Freq: Three times a day (TID) | ORAL | 0 refills | Status: DC | PRN

## 2021-05-30 MED ORDER — MOLNUPIRAVIR EUA 200MG CAPSULE: 4.0000 | ORAL_CAPSULE | Freq: Two times a day (BID) | ORAL | 0 refills | Status: AC

## 2021-05-30 NOTE — Progress Notes (Signed)
Virtual Visit Consent   Andrea Oneal, you are scheduled for a virtual visit with a [facility] provider today.     Just as with appointments in the office, your consent must be obtained to participate.  Your consent will be active for this visit and any virtual visit you may have with one of our providers in the next 365 days.     If you have a MyChart account, a copy of this consent can be sent to you electronically.  All virtual visits are billed to your insurance company just like a traditional visit in the office.    As this is a virtual visit, video technology does not allow for your provider to perform a traditional examination.  This may limit your provider's ability to fully assess your condition.  If your provider identifies any concerns that need to be evaluated in person or the need to arrange testing (such as labs, EKG, etc.), we will make arrangements to do so.     Although advances in technology are sophisticated, we cannot ensure that it will always work on either your end or our end.  If the connection with a video visit is poor, the visit may have to be switched to a telephone visit.  With either a video or telephone visit, we are not always able to ensure that we have a secure connection.     I need to obtain your verbal consent now.   Are you willing to proceed with your visit today?    Andrea Oneal has provided verbal consent on 05/30/2021 for a virtual visit (video or telephone).   Piedad Climes, [place]   Date: 05/30/2021 3:21 PM   Virtual Visit via Video Note   I, Piedad Climes, connected with  Andrea Oneal  ([id number], 12-17-1960) on 05/30/21 at  3:15 PM EST by a video-enabled telemedicine application and verified that I am speaking with the correct person using two identifiers.  Location: Patient: Virtual Visit Location Patient: Home Provider: Virtual Visit Location Provider: Home Office   I discussed the limitations of evaluation and  management by telemedicine and the availability of in person appointments. The patient expressed understanding and agreed to proceed.    History of Present Illness: Andrea Oneal is a 61 y.o. who identifies as a female who was assigned female at birth, and is being seen today for COVID-19. Endorses symptoms starting Monday night with nasal congestion, cough and chest tightness. Unsure of fever. Has been taking OTC medications with Tylenol. Denies chest pain or SOB. Denies vomiting but notes nausea and some loose stools. Has not had COVID before to her knowledge. Test this morning  was positive.   HPI: HPI  Problems:  Patient Active Problem List   Diagnosis Date Noted   Thyroid nodule 07/27/2020   Vertigo 08/10/2019   Hypertension 04/01/2019   Eczema 03/05/2018   Hyperglycemia 03/05/2018   Internal hemorrhoid 03/18/2017   IBS (irritable bowel syndrome) 03/18/2017   Anxiety 07/01/2016   Depression 07/01/2016   Allergic rhinitis 07/01/2016   Migraines 07/01/2016   Obese 07/01/2016    Allergies:  Allergies  Allergen Reactions   Beef (Bovine) Protein    Medications:  Current Outpatient Medications:    molnupiravir EUA (LAGEVRIO) 200 mg CAPS capsule, Take 4 capsules (800 mg total) by mouth 2 (two) times daily for 5 days., Disp: 40 capsule, Rfl: 0   ondansetron (ZOFRAN-ODT) 4 MG disintegrating tablet, Take 1 tablet (4 mg total) by  mouth every 8 (eight) hours as needed for nausea or vomiting., Disp: 20 tablet, Rfl: 0   ALPRAZolam (XANAX) 0.5 MG tablet, TAKE 1 TABLET (0.5 MG TOTAL) BY MOUTH AT BEDTIME. NEED OFFICE VISIT FOR MORE REFILLS., Disp: 15 tablet, Rfl: 0   amLODipine (NORVASC) 5 MG tablet, TAKE 1 TABLET (5 MG TOTAL) BY MOUTH DAILY., Disp: 90 tablet, Rfl: 0   KLOR-CON M20 20 MEQ tablet, TAKE 1 TABLET BY MOUTH TWICE A DAY, Disp: 180 tablet, Rfl: 0   loratadine (CLARITIN) 10 MG tablet, Take 1 tablet (10 mg total) by mouth daily., Disp: 30 tablet, Rfl: 11    losartan-hydrochlorothiazide (HYZAAR) 100-25 MG tablet, Take 1 tablet by mouth daily., Disp: 90 tablet, Rfl: 1   Multiple Vitamin (MULTIVITAMIN WITH MINERALS) TABS tablet, Take 1 tablet by mouth daily., Disp: , Rfl:    SUMAtriptan (IMITREX) 50 MG tablet, Take 1 tablet (50 mg total) by mouth once as needed for up to 1 dose for migraine. May repeat in 2 hours if headache persists or recurs., Disp: 10 tablet, Rfl: 5   triamcinolone (KENALOG) 0.1 %, Apply 1 application topically 2 (two) times daily as needed (eczema)., Disp: 454 g, Rfl: 1   venlafaxine XR (EFFEXOR XR) 75 MG 24 hr capsule, Take 1 capsule (75 mg total) by mouth daily with breakfast., Disp: 90 capsule, Rfl: 1  Observations/Objective: Patient is well-developed, well-nourished in no acute distress.  Resting comfortably at home.  Head is normocephalic, atraumatic.  No labored breathing. Speech is clear and coherent with logical content.  Patient is alert and oriented at baseline.   Assessment and Plan: 1. COVID-19 - MyChart COVID-19 home monitoring program; Future - ondansetron (ZOFRAN-ODT) 4 MG disintegrating tablet; Take 1 tablet (4 mg total) by mouth every 8 (eight) hours as needed for nausea or vomiting.  Dispense: 20 tablet; Refill: 0 - molnupiravir EUA (LAGEVRIO) 200 mg CAPS capsule; Take 4 capsules (800 mg total) by mouth 2 (two) times daily for 5 days.  Dispense: 40 capsule; Refill: 0  Patient with multiple risk factors for complicated course of illness. Discussed risks/benefits of antiviral medications including most common potential ADRs. Patient voiced understanding and would like to proceed with antiviral medication. They are candidate for molnupiravir. Rx sent to pharmacy. Supportive measures, OTC medications and vitamin regimen reviewed. Zofran for nausea. Patient has been enrolled in a MyChart COVID symptom monitoring program. Anne Shutter reviewed in detail. Strict ER precautions discussed with patient.   Follow Up  Instructions: I discussed the assessment and treatment plan with the patient. The patient was provided an opportunity to ask questions and all were answered. The patient agreed with the plan and demonstrated an understanding of the instructions.  A copy of instructions were sent to the patient via MyChart unless otherwise noted below.   The patient was advised to call back or seek an in-person evaluation if the symptoms worsen or if the condition fails to improve as anticipated.  Time:  I spent 12 minutes with the patient via telehealth technology discussing the above problems/concerns.    Piedad Climes, PA-C

## 2021-05-30 NOTE — Patient Instructions (Signed)
Andrea Oneal, thank you for joining Andrea Climes, PA-C for today's virtual visit.  While this provider is not your primary care provider (PCP), if your PCP is located in our provider database this encounter information will be shared with them immediately following your visit.  Consent: (Patient) Andrea Oneal provided verbal consent for this virtual visit at the beginning of the encounter.  Current Medications:  Current Outpatient Medications:    ALPRAZolam (XANAX) 0.5 MG tablet, TAKE 1 TABLET (0.5 MG TOTAL) BY MOUTH AT BEDTIME. NEED OFFICE VISIT FOR MORE REFILLS., Disp: 15 tablet, Rfl: 0   amLODipine (NORVASC) 5 MG tablet, TAKE 1 TABLET (5 MG TOTAL) BY MOUTH DAILY., Disp: 90 tablet, Rfl: 0   KLOR-CON M20 20 MEQ tablet, TAKE 1 TABLET BY MOUTH TWICE A DAY, Disp: 180 tablet, Rfl: 0   loratadine (CLARITIN) 10 MG tablet, Take 1 tablet (10 mg total) by mouth daily., Disp: 30 tablet, Rfl: 11   losartan-hydrochlorothiazide (HYZAAR) 100-25 MG tablet, Take 1 tablet by mouth daily., Disp: 90 tablet, Rfl: 1   Multiple Vitamin (MULTIVITAMIN WITH MINERALS) TABS tablet, Take 1 tablet by mouth daily., Disp: , Rfl:    ondansetron (ZOFRAN) 4 MG tablet, Take 4 mg by mouth as needed for nausea or vomiting., Disp: , Rfl:    SUMAtriptan (IMITREX) 50 MG tablet, Take 1 tablet (50 mg total) by mouth once as needed for up to 1 dose for migraine. May repeat in 2 hours if headache persists or recurs., Disp: 10 tablet, Rfl: 5   triamcinolone (KENALOG) 0.1 %, Apply 1 application topically 2 (two) times daily as needed (eczema)., Disp: 454 g, Rfl: 1   venlafaxine XR (EFFEXOR XR) 75 MG 24 hr capsule, Take 1 capsule (75 mg total) by mouth daily with breakfast., Disp: 90 capsule, Rfl: 1   Medications ordered in this encounter:  No orders of the defined types were placed in this encounter.    *If you need refills on other medications prior to your next appointment, please contact your  pharmacy*  Follow-Up: Call back or seek an in-person evaluation if the symptoms worsen or if the condition fails to improve as anticipated.  Other Instructions Please keep well-hydrated and get plenty of rest. Start a saline nasal rinse to flush out your nasal passages. You can use plain Mucinex to help thin congestion. If you have a humidifier, running in the bedroom at night. I want you to start OTC vitamin D3 1000 units daily, vitamin C 1000 mg daily, and a zinc supplement. Please take prescribed medications as directed.  You have been enrolled in a MyChart symptom monitoring program. Please answer these questions daily so we can keep track of how you are doing.  You were to quarantine for 5 days from onset of your symptoms.  After day 5, if you have had no fever and you are feeling better, you can end quarantine but need to mask for an additional 5 days. After day 5 if you have a fever or are having significant symptoms, please quarantine for full 10 days.  If you note any worsening of symptoms, any significant shortness of breath or any chest pain, please seek ER evaluation ASAP.  Please do not delay care!  COVID-19: What to Do if You Are Sick If you test positive and are an older adult or someone who is at high risk of getting very sick from COVID-19, treatment may be available. Contact a healthcare provider right away after a positive test  to determine if you are eligible, even if your symptoms are mild right now. You can also visit a Test to Treat location and, if eligible, receive a prescription from a provider. Don't delay: Treatment must be started within the first few days to be effective. If you have a fever, cough, or other symptoms, you might have COVID-19. Most people have mild illness and are able to recover at home. If you are sick: Keep track of your symptoms. If you have an emergency warning sign (including trouble breathing), call 911. Steps to help prevent the spread  of COVID-19 if you are sick If you are sick with COVID-19 or think you might have COVID-19, follow the steps below to care for yourself and to help protect other people in your home and community. Stay home except to get medical care Stay home. Most people with COVID-19 have mild illness and can recover at home without medical care. Do not leave your home, except to get medical care. Do not visit public areas and do not go to places where you are unable to wear a mask. Take care of yourself. Get rest and stay hydrated. Take over-the-counter medicines, such as acetaminophen, to help you feel better. Stay in touch with your doctor. Call before you get medical care. Be sure to get care if you have trouble breathing, or have any other emergency warning signs, or if you think it is an emergency. Avoid public transportation, ride-sharing, or taxis if possible. Get tested If you have symptoms of COVID-19, get tested. While waiting for test results, stay away from others, including staying apart from those living in your household. Get tested as soon as possible after your symptoms start. Treatments may be available for people with COVID-19 who are at risk for becoming very sick. Don't delay: Treatment must be started early to be effective--some treatments must begin within 5 days of your first symptoms. Contact your healthcare provider right away if your test result is positive to determine if you are eligible. Self-tests are one of several options for testing for the virus that causes COVID-19 and may be more convenient than laboratory-based tests and point-of-care tests. Ask your healthcare provider or your local health department if you need help interpreting your test results. You can visit your state, tribal, local, and territorial health department's website to look for the latest local information on testing sites. Separate yourself from other people As much as possible, stay in a specific room and away  from other people and pets in your home. If possible, you should use a separate bathroom. If you need to be around other people or animals in or outside of the home, wear a well-fitting mask. Tell your close contacts that they may have been exposed to COVID-19. An infected person can spread COVID-19 starting 48 hours (or 2 days) before the person has any symptoms or tests positive. By letting your close contacts know they may have been exposed to COVID-19, you are helping to protect everyone. See COVID-19 and Animals if you have questions about pets. If you are diagnosed with COVID-19, someone from the health department may call you. Answer the call to slow the spread. Monitor your symptoms Symptoms of COVID-19 include fever, cough, or other symptoms. Follow care instructions from your healthcare provider and local health department. Your local health authorities may give instructions on checking your symptoms and reporting information. When to seek emergency medical attention Look for emergency warning signs* for COVID-19. If someone is showing  any of these signs, seek emergency medical care immediately: Trouble breathing Persistent pain or pressure in the chest New confusion Inability to wake or stay awake Pale, gray, or blue-colored skin, lips, or nail beds, depending on skin tone *This list is not all possible symptoms. Please call your medical provider for any other symptoms that are severe or concerning to you. Call 911 or call ahead to your local emergency facility: Notify the operator that you are seeking care for someone who has or may have COVID-19. Call ahead before visiting your doctor Call ahead. Many medical visits for routine care are being postponed or done by phone or telemedicine. If you have a medical appointment that cannot be postponed, call your doctor's office, and tell them you have or may have COVID-19. This will help the office protect themselves and other patients. If  you are sick, wear a well-fitting mask You should wear a mask if you must be around other people or animals, including pets (even at home). Wear a mask with the best fit, protection, and comfort for you. You don't need to wear the mask if you are alone. If you can't put on a mask (because of trouble breathing, for example), cover your coughs and sneezes in some other way. Try to stay at least 6 feet away from other people. This will help protect the people around you. Masks should not be placed on young children under age 56 years, anyone who has trouble breathing, or anyone who is not able to remove the mask without help. Cover your coughs and sneezes Cover your mouth and nose with a tissue when you cough or sneeze. Throw away used tissues in a lined trash can. Immediately wash your hands with soap and water for at least 20 seconds. If soap and water are not available, clean your hands with an alcohol-based hand sanitizer that contains at least 60% alcohol. Clean your hands often Wash your hands often with soap and water for at least 20 seconds. This is especially important after blowing your nose, coughing, or sneezing; going to the bathroom; and before eating or preparing food. Use hand sanitizer if soap and water are not available. Use an alcohol-based hand sanitizer with at least 60% alcohol, covering all surfaces of your hands and rubbing them together until they feel dry. Soap and water are the best option, especially if hands are visibly dirty. Avoid touching your eyes, nose, and mouth with unwashed hands. Handwashing Tips Avoid sharing personal household items Do not share dishes, drinking glasses, cups, eating utensils, towels, or bedding with other people in your home. Wash these items thoroughly after using them with soap and water or put in the dishwasher. Clean surfaces in your home regularly Clean and disinfect high-touch surfaces (for example, doorknobs, tables, handles, light  switches, and countertops) in your "sick room" and bathroom. In shared spaces, you should clean and disinfect surfaces and items after each use by the person who is ill. If you are sick and cannot clean, a caregiver or other person should only clean and disinfect the area around you (such as your bedroom and bathroom) on an as needed basis. Your caregiver/other person should wait as long as possible (at least several hours) and wear a mask before entering, cleaning, and disinfecting shared spaces that you use. Clean and disinfect areas that may have blood, stool, or body fluids on them. Use household cleaners and disinfectants. Clean visible dirty surfaces with household cleaners containing soap or detergent. Then, use  a household disinfectant. Use a product from [employer] List N: Disinfectants for Coronavirus (COVID-19). Be sure to follow the instructions on the label to ensure safe and effective use of the product. Many products recommend keeping the surface wet with a disinfectant for a certain period of time (look at "contact time" on the product label). You may also need to wear personal protective equipment, such as gloves, depending on the directions on the product label. Immediately after disinfecting, wash your hands with soap and water for 20 seconds. For completed guidance on cleaning and disinfecting your home, visit Complete Disinfection Guidance. Take steps to improve ventilation at home Improve ventilation (air flow) at home to help prevent from spreading COVID-19 to other people in your household. Clear out COVID-19 virus particles in the air by opening windows, using air filters, and turning on fans in your home. Use this interactive tool to learn how to improve air flow in your home. When you can be around others after being sick with COVID-19 Deciding when you can be around others is different for different situations. Find out when you can safely end home isolation. For any additional  questions about your care, contact your healthcare provider or state or local health department. 07/17/2020 Content source: [facility] ([facility]), Division of Viral Diseases This information is not intended to replace advice given to you by your health care provider. Make sure you discuss any questions you have with your health care provider. Document Revised: 08/30/2020 Document Reviewed: 08/30/2020 Elsevier Patient Education  2022 [employer].      If you have been instructed to have an in-person evaluation today at a local Urgent Care facility, please use the link below. It will take you to a list of all of our available [facility], including address, phone number and hours of operation. Please do not delay care.  [facility]  If you or a family member do not have a primary care provider, use the link below to schedule a visit and establish care. When you choose a [facility] primary care physician or advanced practice provider, you gain a long-term partner in health. Find a Primary Care Provider  Learn more about [facility] in-office and virtual care options: [facility] - Get Care Now

## 2021-05-31 ENCOUNTER — Telehealth: Payer: Self-pay | Admitting: *Deleted

## 2021-05-31 NOTE — Telephone Encounter (Signed)
COVID Questionnaire worsening symptoms: Diarrhea  Attempted to contact patient regarding new symptom- left message to call back if needed- will send MyChart- message for symptoms.

## 2021-06-12 ENCOUNTER — Telehealth: Payer: Self-pay | Admitting: *Deleted

## 2021-06-12 NOTE — Telephone Encounter (Signed)
Called patient to review worsening symptoms of cough and weakness documented on [facility] My Chart covid questionnaire responses. No answer. Left voicemail to call back to review symptoms at #[phone].

## 2021-06-12 NOTE — Telephone Encounter (Signed)
Patient returned call:  Patient reports her cough is worse but she is relating it to her allergies and she is going to take her OTC allergy treatment for it. Advised patient if it does not improve please contact her PCP. Patient also reports increased/worsening fatigue. Patient states she is just "tired" lack of energy- she is still active but just fatigued all the time. Patient advised she may want to follow up with PCP for advised on vitamins that may be helpful- or labs to determine if deficient in anyway. Patient again advised patient to follow up with PCP if does not improve with time or gets worse.

## 2021-07-11 ENCOUNTER — Other Ambulatory Visit: Payer: Self-pay | Admitting: Internal Medicine

## 2021-07-15 ENCOUNTER — Other Ambulatory Visit: Payer: Self-pay | Admitting: Internal Medicine

## 2021-07-31 ENCOUNTER — Ambulatory Visit
Admission: RE | Admit: 2021-07-31 | Discharge: 2021-07-31 | Disposition: A | Discharge status: Home / Self Care | Payer: Commercial Managed Care - PPO | Source: Ambulatory Visit | Attending: Internal Medicine | Admitting: Internal Medicine

## 2021-07-31 ENCOUNTER — Other Ambulatory Visit: Payer: Self-pay | Admitting: Internal Medicine

## 2021-07-31 DIAGNOSIS — Z1231 Encounter for screening mammogram for malignant neoplasm of breast: Secondary | ICD-10-CM

## 2021-08-12 ENCOUNTER — Other Ambulatory Visit: Payer: Self-pay | Admitting: Internal Medicine

## 2021-08-13 ENCOUNTER — Other Ambulatory Visit: Payer: Self-pay | Admitting: Internal Medicine

## 2021-08-16 ENCOUNTER — Encounter: Payer: Self-pay | Admitting: Internal Medicine

## 2021-08-26 ENCOUNTER — Other Ambulatory Visit: Payer: Self-pay | Admitting: Internal Medicine

## 2021-09-18 ENCOUNTER — Encounter: Payer: Self-pay | Admitting: Internal Medicine

## 2021-09-18 DIAGNOSIS — E785 Hyperlipidemia, unspecified: Secondary | ICD-10-CM | POA: Insufficient documentation

## 2021-09-18 NOTE — Progress Notes (Unsigned)
Subjective:    Patient ID: Andrea Oneal, female    DOB: 1960-11-30, 61 y.o.   MRN: [id number]      HPI Andrea Oneal is here for  Chief Complaint  Patient presents with   Annual Exam    Has increased stress.  She knows she needs to be better about taking care of herself first.     Medications and allergies reviewed with patient and updated if appropriate.  Current Outpatient Medications on File Prior to Visit  Medication Sig Dispense Refill   ALPRAZolam (XANAX) 0.5 MG tablet TAKE 1 TABLET (0.5 MG TOTAL) BY MOUTH AT BEDTIME. NEED OFFICE VISIT FOR MORE REFILLS. 15 tablet 0   amLODipine (NORVASC) 5 MG tablet TAKE 1 TABLET BY MOUTH EVERY DAY 30 tablet 0   KLOR-CON M20 20 MEQ tablet TAKE 1 TABLET BY MOUTH TWICE A DAY 180 tablet 0   loratadine (CLARITIN) 10 MG tablet Take 1 tablet (10 mg total) by mouth daily. 30 tablet 11   losartan-hydrochlorothiazide (HYZAAR) 100-25 MG tablet TAKE 1 TABLET BY MOUTH EVERY DAY 90 tablet 1   Multiple Vitamin (MULTIVITAMIN WITH MINERALS) TABS tablet Take 1 tablet by mouth daily.     ondansetron (ZOFRAN-ODT) 4 MG disintegrating tablet Take 1 tablet (4 mg total) by mouth every 8 (eight) hours as needed for nausea or vomiting. 20 tablet 0   SUMAtriptan (IMITREX) 50 MG tablet Take 1 tablet (50 mg total) by mouth once as needed for up to 1 dose for migraine. May repeat in 2 hours if headache persists or recurs. 10 tablet 5   triamcinolone (KENALOG) 0.1 % Apply 1 application topically 2 (two) times daily as needed (eczema). 454 g 1   venlafaxine XR (EFFEXOR-XR) 75 MG 24 hr capsule TAKE 1 CAPSULE BY MOUTH DAILY WITH BREAKFAST. 90 capsule 0   meloxicam (MOBIC) 7.5 MG tablet SMARTSIG:1 Tablet(s) By Mouth Every 12 Hours     No current facility-administered medications on file prior to visit.    Review of Systems  Constitutional:  Negative for fever.  Eyes:  Negative for visual disturbance.  Respiratory:  Negative for cough, shortness of breath and wheezing.    Cardiovascular:  Negative for chest pain, palpitations and leg swelling.  Gastrointestinal:  Positive for nausea (with migraines). Negative for abdominal pain, blood in stool, constipation and diarrhea.       No gerd  Genitourinary:  Negative for dysuria.  Musculoskeletal:  Positive for arthralgias and back pain (intermittent).  Skin:  Negative for rash.  Neurological:  Positive for headaches (infrequent migraines). Negative for light-headedness.  Psychiatric/Behavioral:  Positive for dysphoric mood. The patient is nervous/anxious.       Objective:   Vitals:   09/19/21 1006  BP: 132/80  Pulse: 80  Temp: 98.6 F (37 C)  SpO2: 96%   Filed Weights   09/19/21 1006  Weight: 198 lb (89.8 kg)   Body mass index is 31.96 kg/m.  BP Readings from Last 3 Encounters:  09/19/21 132/80  07/27/20 140/80  01/08/20 (!) 164/108    Wt Readings from Last 3 Encounters:  09/19/21 198 lb (89.8 kg)  07/27/20 198 lb (89.8 kg)  08/10/19 200 lb (90.7 kg)       07/27/2020   12:39 PM 05/03/2019    8:49 AM  Depression screen PHQ 2/9  Decreased Interest 1 0  Down, Depressed, Hopeless 1 0  PHQ - 2 Score 2 0  Altered sleeping 1 1  Tired, decreased energy 1 0  Change in appetite 0 0  Feeling bad or failure about yourself  0 0  Trouble concentrating 0 0  Moving slowly or fidgety/restless 0 0  Suicidal thoughts 0 0  PHQ-9 Score 4 1  Difficult doing work/chores Not difficult at all         07/27/2020   12:39 PM 05/03/2019    8:48 AM  GAD 7 : Generalized Anxiety Score  Nervous, Anxious, on Edge 1 1  Control/stop worrying 1 0  Worry too much - different things 1 0  Trouble relaxing 1 1  Restless 1 0  Easily annoyed or irritable 0 1  Afraid - awful might happen 0 0  Total GAD 7 Score 5 3  Anxiety Difficulty Not difficult at all         Physical Exam Constitutional: She appears well-developed and well-nourished. No distress.  HENT:  Head: Normocephalic and atraumatic.  Right Ear:  External ear normal. Normal ear canal and TM Left Ear: External ear normal.  Normal ear canal and TM Mouth/Throat: Oropharynx is clear and moist.  Eyes: Conjunctivae normal.  Neck: Neck supple. No tracheal deviation present. No thyromegaly present.  No carotid bruit  Cardiovascular: Normal rate, regular rhythm and normal heart sounds.   No murmur heard.  No edema. Pulmonary/Chest: Effort normal and breath sounds normal. No respiratory distress. She has no wheezes. She has no rales.  Breast: deferred   Abdominal: Soft. She exhibits no distension. There is no tenderness.  Lymphadenopathy: She has no cervical adenopathy.  Skin: Skin is warm and dry. She is not diaphoretic.  Psychiatric: She has a normal mood and affect. Her behavior is normal.     Lab Results  Component Value Date   WBC 6.5 07/27/2020   HGB 13.7 07/27/2020   HCT 41.2 07/27/2020   PLT 286.0 07/27/2020   GLUCOSE 102 (H) 07/27/2020   CHOL 215 (H) 07/27/2020   TRIG 147.0 07/27/2020   HDL 51.10 07/27/2020   LDLCALC 135 (H) 07/27/2020   ALT 34 07/27/2020   AST 26 07/27/2020   NA 138 07/27/2020   K 4.2 07/27/2020   CL 101 07/27/2020   CREATININE 0.72 07/27/2020   BUN 10 07/27/2020   CO2 30 07/27/2020   TSH 2.72 07/27/2020   HGBA1C 5.5 07/27/2020    The 10-year ASCVD risk score (Arnett DK, et al., 2019) is: 5.2%   Values used to calculate the score:     Age: 61 years     Sex: Female     Is Non-Hispanic African American: No     Diabetic: No     Tobacco smoker: No     Systolic Blood Pressure: [id number] mmHg     Is BP treated: Yes     HDL Cholesterol: 51.1 mg/dL     Total Cholesterol: 215 mg/dL       Assessment & Plan:   Physical exam: Screening blood work  ordered Exercise  regular Weight advised weight loss - she is working on it Substance abuse  none   Reviewed recommended immunizations.   Health Maintenance  Topic Date Due   Zoster Vaccines- Shingrix (1 of 2) Never done   COVID-19 Vaccine (4 -  Booster for Pfizer series) 05/25/2020   COLONOSCOPY (Pts 45-44yrs Insurance coverage will need to be confirmed)  11/24/2022 (Originally 05/15/2006)   INFLUENZA VACCINE  11/26/2021   PAP SMEAR-Modifier  05/02/2022   MAMMOGRAM  08/01/2023   TETANUS/TDAP  03/31/2029   Hepatitis C Screening  Completed  HIV Screening  Completed   HPV VACCINES  Aged Out          See Problem List for Assessment and Plan of chronic medical problems.

## 2021-09-18 NOTE — Patient Instructions (Addendum)
Blood work was ordered.     Medications changes include :   none   Your prescription(s) have been sent to your pharmacy.     Return in about 6 months (around 03/22/2022) for follow up.   Health Maintenance, Female Adopting a healthy lifestyle and getting preventive care are important in promoting health and wellness. Ask your health care provider about: The right schedule for you to have regular tests and exams. Things you can do on your own to prevent diseases and keep yourself healthy. What should I know about diet, weight, and exercise? Eat a healthy diet  Eat a diet that includes plenty of vegetables, fruits, low-fat dairy products, and lean protein. Do not eat a lot of foods that are high in solid fats, added sugars, or sodium. Maintain a healthy weight Body mass index (BMI) is used to identify weight problems. It estimates body fat based on height and weight. Your health care provider can help determine your BMI and help you achieve or maintain a healthy weight. Get regular exercise Get regular exercise. This is one of the most important things you can do for your health. Most adults should: Exercise for at least 150 minutes each week. The exercise should increase your heart rate and make you sweat (moderate-intensity exercise). Do strengthening exercises at least twice a week. This is in addition to the moderate-intensity exercise. Spend less time sitting. Even light physical activity can be beneficial. Watch cholesterol and blood lipids Have your blood tested for lipids and cholesterol at 60 years of age, then have this test every 5 years. Have your cholesterol levels checked more often if: Your lipid or cholesterol levels are high. You are older than 60 years of age. You are at high risk for heart disease. What should I know about cancer screening? Depending on your health history and family history, you may need to have cancer screening at various ages. This may  include screening for: Breast cancer. Cervical cancer. Colorectal cancer. Skin cancer. Lung cancer. What should I know about heart disease, diabetes, and high blood pressure? Blood pressure and heart disease High blood pressure causes heart disease and increases the risk of stroke. This is more likely to develop in people who have high blood pressure readings or are overweight. Have your blood pressure checked: Every 3-5 years if you are 50-76 years of age. Every year if you are 76 years old or older. Diabetes Have regular diabetes screenings. This checks your fasting blood sugar level. Have the screening done: Once every three years after age 53 if you are at a normal weight and have a low risk for diabetes. More often and at a younger age if you are overweight or have a high risk for diabetes. What should I know about preventing infection? Hepatitis B If you have a higher risk for hepatitis B, you should be screened for this virus. Talk with your health care provider to find out if you are at risk for hepatitis B infection. Hepatitis C Testing is recommended for: Everyone born from [birth year] through 1964. Anyone with known risk factors for hepatitis C. Sexually transmitted infections (STIs) Get screened for STIs, including gonorrhea and chlamydia, if: You are sexually active and are younger than 60 years of age. You are older than 60 years of age and your health care provider tells you that you are at risk for this type of infection. Your sexual activity has changed since you were last screened, and you are  at increased risk for chlamydia or gonorrhea. Ask your health care provider if you are at risk. Ask your health care provider about whether you are at high risk for HIV. Your health care provider may recommend a prescription medicine to help prevent HIV infection. If you choose to take medicine to prevent HIV, you should first get tested for HIV. You should then be tested every 3 months  for as long as you are taking the medicine. Pregnancy If you are about to stop having your period (premenopausal) and you may become pregnant, seek counseling before you get pregnant. Take 400 to 800 micrograms (mcg) of folic acid every day if you become pregnant. Ask for birth control (contraception) if you want to prevent pregnancy. Osteoporosis and menopause Osteoporosis is a disease in which the bones lose minerals and strength with aging. This can result in bone fractures. If you are 7 years old or older, or if you are at risk for osteoporosis and fractures, ask your health care provider if you should: Be screened for bone loss. Take a calcium or vitamin D supplement to lower your risk of fractures. Be given hormone replacement therapy (HRT) to treat symptoms of menopause. Follow these instructions at home: Alcohol use Do not drink alcohol if: Your health care provider tells you not to drink. You are pregnant, may be pregnant, or are planning to become pregnant. If you drink alcohol: Limit how much you have to: 0-1 drink a day. Know how much alcohol is in your drink. In the [place] one drink equals one 12 oz bottle of beer (355 mL), one 5 oz glass of wine (148 mL), or one 1 oz glass of hard liquor (44 mL). Lifestyle Do not use any products that contain nicotine or tobacco. These products include cigarettes, chewing tobacco, and vaping devices, such as e-cigarettes. If you need help quitting, ask your health care provider. Do not use street drugs. Do not share needles. Ask your health care provider for help if you need support or information about quitting drugs. General instructions Schedule regular health, dental, and eye exams. Stay current with your vaccines. Tell your health care provider if: You often feel depressed. You have ever been abused or do not feel safe at home. Summary Adopting a healthy lifestyle and getting preventive care are important in promoting health and  wellness. Follow your health care provider's instructions about healthy diet, exercising, and getting tested or screened for diseases. Follow your health care provider's instructions on monitoring your cholesterol and blood pressure. This information is not intended to replace advice given to you by your health care provider. Make sure you discuss any questions you have with your health care provider. Document Revised: 09/03/2020 Document Reviewed: 09/03/2020 Elsevier Patient Education  [place].

## 2021-09-19 ENCOUNTER — Ambulatory Visit (INDEPENDENT_AMBULATORY_CARE_PROVIDER_SITE_OTHER): Payer: Commercial Managed Care - PPO | Admitting: Internal Medicine

## 2021-09-19 VITALS — BP 132/80 | HR 80 | Temp 98.6°F | Ht 66.0 in | Wt 198.0 lb

## 2021-09-19 DIAGNOSIS — F419 Anxiety disorder, unspecified: Secondary | ICD-10-CM | POA: Diagnosis not present

## 2021-09-19 DIAGNOSIS — Z0000 Encounter for general adult medical examination without abnormal findings: Secondary | ICD-10-CM | POA: Diagnosis not present

## 2021-09-19 DIAGNOSIS — E782 Mixed hyperlipidemia: Secondary | ICD-10-CM

## 2021-09-19 DIAGNOSIS — F3289 Other specified depressive episodes: Secondary | ICD-10-CM

## 2021-09-19 DIAGNOSIS — I10 Essential (primary) hypertension: Secondary | ICD-10-CM | POA: Diagnosis not present

## 2021-09-19 DIAGNOSIS — G43809 Other migraine, not intractable, without status migrainosus: Secondary | ICD-10-CM

## 2021-09-19 DIAGNOSIS — R739 Hyperglycemia, unspecified: Secondary | ICD-10-CM

## 2021-09-19 LAB — COMPREHENSIVE METABOLIC PANEL
ALT: 32 U/L (ref 0–35)
AST: 25 U/L (ref 0–37)
Albumin: 4.5 g/dL (ref 3.5–5.2)
Alkaline Phosphatase: 68 U/L (ref 39–117)
BUN: 17 mg/dL (ref 6–23)
CO2: 30 mEq/L (ref 19–32)
Calcium: 9.6 mg/dL (ref 8.4–10.5)
Chloride: 100 mEq/L (ref 96–112)
Creatinine, Ser: 0.71 mg/dL (ref 0.40–1.20)
GFR: 92.46 mL/min (ref >60.00)
Glucose, Bld: 107 mg/dL — ABNORMAL HIGH (ref 70–99)
Potassium: 3.7 mEq/L (ref 3.5–5.1)
Sodium: 138 mEq/L (ref 135–145)
Total Bilirubin: 0.8 mg/dL (ref 0.2–1.2)
Total Protein: 7.4 g/dL (ref 6.0–8.3)

## 2021-09-19 LAB — CBC WITH DIFFERENTIAL/PLATELET
Basophils Absolute: 0.1 10*3/uL (ref 0.0–0.1)
Basophils Relative: 1.4 % (ref 0.0–3.0)
Eosinophils Absolute: 0.2 10*3/uL (ref 0.0–0.7)
Eosinophils Relative: 4 % (ref 0.0–5.0)
HCT: 39.7 % (ref 36.0–46.0)
Hemoglobin: 13.1 g/dL (ref 12.0–15.0)
Lymphocytes Relative: 31 % (ref 12.0–46.0)
Lymphs Abs: 1.7 10*3/uL (ref 0.7–4.0)
MCHC: 33.1 g/dL (ref 30.0–36.0)
MCV: 90.7 fl (ref 78.0–100.0)
Monocytes Absolute: 0.3 10*3/uL (ref 0.1–1.0)
Monocytes Relative: 5.6 % (ref 3.0–12.0)
Neutro Abs: 3.2 10*3/uL (ref 1.4–7.7)
Neutrophils Relative %: 58 % (ref 43.0–77.0)
Platelets: 288 10*3/uL (ref 150.0–400.0)
RBC: 4.37 Mil/uL (ref 3.87–5.11)
RDW: 13.2 % (ref 11.5–15.5)
WBC: 5.6 10*3/uL (ref 4.0–10.5)

## 2021-09-19 LAB — LIPID PANEL
Cholesterol: 215 mg/dL — ABNORMAL HIGH (ref 0–200)
HDL: 53.7 mg/dL (ref >39.00)
LDL Cholesterol: 139 mg/dL — ABNORMAL HIGH (ref 0–99)
NonHDL: 160.98
Total CHOL/HDL Ratio: 4
Triglycerides: 111 mg/dL (ref 0.0–149.0)
VLDL: 22.2 mg/dL (ref 0.0–40.0)

## 2021-09-19 LAB — TSH: TSH: 2.32 u[IU]/mL (ref 0.35–5.50)

## 2021-09-19 LAB — HEMOGLOBIN A1C: Hgb A1c MFr Bld: 5.5 % (ref 4.6–6.5)

## 2021-09-19 MED ORDER — AMLODIPINE BESYLATE 5 MG PO TABS: 5.0000 mg | ORAL_TABLET | Freq: Every day | ORAL | 1 refills | Status: DC

## 2021-09-19 MED ORDER — MELOXICAM 7.5 MG PO TABS: ORAL_TABLET | ORAL | 5 refills | Status: DC

## 2021-09-19 MED ORDER — VENLAFAXINE HCL ER 75 MG PO CP24: 75.0000 mg | ORAL_CAPSULE | Freq: Every day | ORAL | 1 refills | Status: DC

## 2021-09-19 MED ORDER — ALPRAZOLAM 0.5 MG PO TABS: 0.5000 mg | ORAL_TABLET | Freq: Every day | ORAL | 0 refills | Status: DC

## 2021-09-19 NOTE — Assessment & Plan Note (Signed)
Chronic Controlled, Stable Continue Effexor 75 mg daily 

## 2021-09-19 NOTE — Assessment & Plan Note (Signed)
Chronic Blood pressure well controlled CMP Continue amlodipine 5 mg daily, losartan-hydrochlorothiazide 100-25 milligram daily

## 2021-09-19 NOTE — Assessment & Plan Note (Signed)
Chronic Check a1c Low sugar / carb diet Stressed regular exercise  

## 2021-09-19 NOTE — Assessment & Plan Note (Signed)
Chronic Currently trying to control with lifestyle Check lipid panel, CMP

## 2021-09-19 NOTE — Assessment & Plan Note (Addendum)
Chronic increased stress overall, but she feels her anxiety is controlle Continue Effexor 75 mg daily, xanax 0.5 mg bedtime as needed

## 2021-09-19 NOTE — Assessment & Plan Note (Signed)
Chronic Infrequent migraines Imitrex 50 g as needed, Zofran 4 mg as needed

## 2021-12-03 ENCOUNTER — Encounter: Payer: Self-pay | Admitting: Internal Medicine

## 2021-12-11 ENCOUNTER — Other Ambulatory Visit: Payer: Self-pay | Admitting: Internal Medicine

## 2021-12-14 ENCOUNTER — Other Ambulatory Visit: Payer: Self-pay | Admitting: Internal Medicine

## 2022-02-02 ENCOUNTER — Other Ambulatory Visit: Payer: Self-pay | Admitting: Internal Medicine

## 2022-02-03 MED ORDER — POTASSIUM CHLORIDE CRYS ER 20 MEQ PO TBCR: 20.0000 meq | EXTENDED_RELEASE_TABLET | Freq: Two times a day (BID) | ORAL | 0 refills | Status: DC

## 2022-02-20 ENCOUNTER — Ambulatory Visit
Admission: EM | Admit: 2022-02-20 | Discharge: 2022-02-20 | Disposition: A | Discharge status: Home / Self Care | Payer: Commercial Managed Care - PPO | Attending: Emergency Medicine | Admitting: Emergency Medicine

## 2022-02-20 DIAGNOSIS — H00015 Hordeolum externum left lower eyelid: Secondary | ICD-10-CM | POA: Diagnosis not present

## 2022-02-20 DIAGNOSIS — L03213 Periorbital cellulitis: Secondary | ICD-10-CM | POA: Diagnosis not present

## 2022-02-20 MED ORDER — SULFAMETHOXAZOLE-TRIMETHOPRIM 800-160 MG PO TABS: 2.0000 | ORAL_TABLET | Freq: Two times a day (BID) | ORAL | 0 refills | Status: AC

## 2022-02-20 NOTE — ED Provider Notes (Signed)
[facility]    CSN: [id number] Arrival date & time: 02/20/22  1045    HISTORY   Chief Complaint  Patient presents with   Eye Problem    Swollen and painful left eye. Don't know if it's a bite or stye - Entered by patient   HPI Andrea Oneal is a pleasant, 61 y.o. female who presents to urgent care today. The patient c/o swelling and bruising below her left eye. The patient states the pain started Monday morning after she noticed a bump in the outer corner of her left eye.  Patient states the bump itself is not painful but the area around the bump is. The patient denies vision changes.  Patient states she tried taking some Tylenol sinus medication with which did not help at all.  Patient states she has never had a similar issue in the past.  Patient denies history of allergies.  Patient states she does wear contacts but is decided to wear her glasses until this resolves.  The history is provided by the patient.  Eye Problem Location:  Left eye Quality:  Burning, aching and dull Severity:  Moderate Onset quality:  Sudden Duration:  3 days Timing:  Constant Progression:  Worsening Chronicity:  New Context: not burn, not chemical exposure, not direct trauma, not foreign body, not using machinery, not scratch, not smoke exposure and not UV exposure   Relieved by:  Nothing Worsened by:  Nothing Associated symptoms: inflammation, swelling and tearing   Associated symptoms: no blurred vision, no crusting, no decreased vision, no discharge, no double vision, no facial rash, no headaches, no itching, no nausea, no numbness, no photophobia, no redness, no scotomas, no tingling, no vomiting and no weakness   Risk factors: no conjunctival hemorrhage, no exposure to pinkeye, no previous injury to eye, no recent herpes zoster and no recent URI   Risk factors comment:  Stye  Past Medical History:  Diagnosis Date   Arthritis    Depression    Hypertension    Migraine    Patient  Active Problem List   Diagnosis Date Noted   Hyperlipidemia 09/18/2021   Thyroid nodule 07/27/2020   Vertigo 08/10/2019   Hypertension 04/01/2019   Eczema 03/05/2018   Hyperglycemia 03/05/2018   Internal hemorrhoid 03/18/2017   IBS (irritable bowel syndrome) 03/18/2017   Anxiety 07/01/2016   Depression 07/01/2016   Allergic rhinitis 07/01/2016   Migraines 07/01/2016   Obese 07/01/2016   Past Surgical History:  Procedure Laterality Date   APPENDECTOMY     TONSILLECTOMY  1986   OB History   No obstetric history on file.    Home Medications    Prior to Admission medications   Medication Sig Start Date End Date Taking? Authorizing Provider  ALPRAZolam (XANAX) 0.5 MG tablet TAKE 1 TABLET (0.5 MG TOTAL) BY MOUTH AT BEDTIME. NEED OFFICE VISIT FOR MORE REFILLS. 12/15/21   Pincus SanesBurns, Stacy J, MD  amLODipine (NORVASC) 5 MG tablet Take 1 tablet (5 mg total) by mouth daily. 09/19/21   Pincus SanesBurns, Stacy J, MD  loratadine (CLARITIN) 10 MG tablet Take 1 tablet (10 mg total) by mouth daily. 08/10/19   Pincus SanesBurns, Stacy J, MD  losartan-hydrochlorothiazide (HYZAAR) 100-25 MG tablet TAKE 1 TABLET BY MOUTH EVERY DAY 08/12/21   Pincus SanesBurns, Stacy J, MD  meloxicam (MOBIC) 7.5 MG tablet Take 1 Tablet By Mouth Every 12 Hours prn 09/19/21   Pincus SanesBurns, Stacy J, MD  Multiple Vitamin (MULTIVITAMIN WITH MINERALS) TABS tablet Take 1 tablet by  mouth daily.    [provider]  ondansetron (ZOFRAN-ODT) 4 MG disintegrating tablet Take 1 tablet (4 mg total) by mouth every 8 (eight) hours as needed for nausea or vomiting. 05/30/21   Waldon Merl, PA-C  potassium chloride SA (KLOR-CON M20) 20 MEQ tablet Take 1 tablet (20 mEq total) by mouth 2 (two) times daily. 02/03/22   Pincus Sanes, MD  SUMAtriptan (IMITREX) 50 MG tablet Take 1 tablet (50 mg total) by mouth once as needed for up to 1 dose for migraine. May repeat in 2 hours if headache persists or recurs. 10/10/20   Pincus Sanes, MD  triamcinolone (KENALOG) 0.1 % Apply 1  application topically 2 (two) times daily as needed (eczema). 07/23/20   Janalyn Harder, MD  venlafaxine XR (EFFEXOR-XR) 75 MG 24 hr capsule Take 1 capsule (75 mg total) by mouth daily with breakfast. 09/19/21   Lawerance Bach, Bobette Mo, MD    Family History Family History  Problem Relation Age of Onset   Arthritis Mother    Diabetes Mother    Depression Mother    Arthritis Father    Heart disease Father    Hypertension Father    Arthritis Maternal Grandmother    Diabetes Maternal Grandfather    Arthritis Paternal Grandmother    Depression Brother    Social History Social History   Tobacco Use   Smoking status: Former    Packs/day: 0.25    Years: 16.00    Total pack years: 4.00    Types: Cigarettes    Start date: 65    Quit date: 1994    Years since quitting: 29.8   Smokeless tobacco: Never  Substance Use Topics   Alcohol use: Yes    Alcohol/week: 7.0 standard drinks of alcohol    Types: 7 Glasses of wine per week   Drug use: Yes    Types: Marijuana   Allergies   Beef (bovine) protein  Review of Systems Review of Systems  Eyes:  Negative for blurred vision, double vision, photophobia, discharge, redness and itching.  Gastrointestinal:  Negative for nausea and vomiting.  Neurological:  Negative for tingling, weakness, numbness and headaches.   Pertinent findings revealed after performing a 14 point review of systems has been noted in the history of present illness.  Physical Exam Triage Vital Signs ED Triage Vitals  Enc Vitals Group     BP 02/22/21 0827 (!) 147/82     Pulse Rate 02/22/21 0827 72     Resp 02/22/21 0827 18     Temp 02/22/21 0827 98.3 F (36.8 C)     Temp Source 02/22/21 0827 Oral     SpO2 02/22/21 0827 98 %     Weight --      Height --      Head Circumference --      Peak Flow --      Pain Score 02/22/21 0826 5     Pain Loc --      Pain Edu? --      Excl. in GC? --   No data found.  Updated Vital Signs BP (!) 151/87 (BP Location: Left Arm)    Pulse 79   Temp 98.2 F (36.8 C) (Oral)   Resp 18   SpO2 96%   Physical Exam Vitals and nursing note reviewed.  Constitutional:      General: She is not in acute distress.    Appearance: Normal appearance. She is not ill-appearing.  HENT:  Head: Normocephalic and atraumatic.     Salivary Glands: Right salivary gland is not diffusely enlarged or tender. Left salivary gland is not diffusely enlarged or tender.     Right Ear: Tympanic membrane, ear canal and external ear normal. No drainage. No middle ear effusion. There is no impacted cerumen. Tympanic membrane is not erythematous or bulging.     Left Ear: Tympanic membrane, ear canal and external ear normal. No drainage.  No middle ear effusion. There is no impacted cerumen. Tympanic membrane is not erythematous or bulging.     Nose: Nose normal. No nasal deformity, septal deviation, mucosal edema, congestion or rhinorrhea.     Right Turbinates: Not enlarged, swollen or pale.     Left Turbinates: Not enlarged, swollen or pale.     Right Sinus: No maxillary sinus tenderness or frontal sinus tenderness.     Left Sinus: No maxillary sinus tenderness or frontal sinus tenderness.     Mouth/Throat:     Lips: Pink. No lesions.     Mouth: Mucous membranes are moist. No oral lesions.     Pharynx: Oropharynx is clear. Uvula midline. No posterior oropharyngeal erythema or uvula swelling.     Tonsils: No tonsillar exudate. 0 on the right. 0 on the left.  Eyes:     General: Lids are normal. Lids are everted, no foreign bodies appreciated. Vision grossly intact. Gaze aligned appropriately.        Right eye: No foreign body or discharge.        Left eye: Hordeolum present.No foreign body or discharge.     Extraocular Movements: Extraocular movements intact.     Conjunctiva/sclera: Conjunctivae normal.     Right eye: Right conjunctiva is not injected.     Left eye: Left conjunctiva is not injected. No chemosis, exudate or hemorrhage.    Comments:  Please see photo below  Neck:     Trachea: Trachea and phonation normal.  Cardiovascular:     Rate and Rhythm: Normal rate and regular rhythm.     Pulses: Normal pulses.     Heart sounds: Normal heart sounds. No murmur heard.    No friction rub. No gallop.  Pulmonary:     Effort: Pulmonary effort is normal. No accessory muscle usage, prolonged expiration or respiratory distress.     Breath sounds: Normal breath sounds. No stridor, decreased air movement or transmitted upper airway sounds. No decreased breath sounds, wheezing, rhonchi or rales.  Chest:     Chest wall: No tenderness.  Musculoskeletal:        General: Normal range of motion.     Cervical back: Normal range of motion and neck supple. Normal range of motion.  Lymphadenopathy:     Cervical: No cervical adenopathy.  Skin:    General: Skin is warm and dry.     Findings: No erythema or rash.  Neurological:     General: No focal deficit present.     Mental Status: She is alert and oriented to person, place, and time.  Psychiatric:        Mood and Affect: Mood normal.        Behavior: Behavior normal.     Visual Acuity Right Eye Distance:   Left Eye Distance:   Bilateral Distance:    Right Eye Near:   Left Eye Near:    Bilateral Near:     UC Couse / Diagnostics / Procedures:     Radiology No results found.  Procedures Procedures (including critical  care time) EKG  Pending results:  Labs Reviewed - No data to display  Medications Ordered in UC: Medications - No data to display  UC Diagnoses / Final Clinical Impressions(s)   I have reviewed the triage vital signs and the nursing notes.  Pertinent labs & imaging results that were available during my care of the patient were reviewed by me and considered in my medical decision making (see chart for details).    Final diagnoses:  Hordeolum externum of left lower eyelid  Preseptal cellulitis of left lower eyelid   Conservative care recommended for  treatment of stye with warm compresses several times a day.  Patient advised that should resolve on its own in 1 to 2 weeks.  Patient provided with prescription for Bactrim for MRSA coverage given presence stye increasing her risk of MRSA infection in the soft tissue around her eye.  Patient advised to follow-up if treatment does not resolve the issue and stye appears to be worsening and not improving.  ED Prescriptions     Medication Sig Dispense Auth. Provider   sulfamethoxazole-trimethoprim (BACTRIM DS) 800-160 MG tablet Take 2 tablets by mouth 2 (two) times daily for 7 days. 28 tablet Theadora Rama Scales, PA-C      PDMP not reviewed this encounter.  Pending results:  Labs Reviewed - No data to display  Discharge Instructions:   Discharge Instructions      I have enclosed information about styes and preseptal cellulitis that I hope you find helpful.  Styes are managed by applying warm compresses 3-4 times daily for 20 minutes to help encourage drainage and soothe inflammation.  Preseptal cellulitis is managed with antibiotics.  After consulting guidelines, because you have a hordeolum in place, is important that antibiotic you take also provides coverage of MRSA.  Bactrim provides good coverage of MRSA along with other opportunistic bacteria that could possibly be causing the infection around your lower eyelid at this time.  Please take 2 tablets twice daily for the next 7 days.  If you have not had resolution of your redness and swelling after completing 7 days of Bactrim or if you find symptoms are getting worse before you finish Bactrim, please return for repeat evaluation or consider going to the emergency room if you are having vision changes or worsening eye pain.  Thank you for visiting urgent care today.      Disposition Upon Discharge:  Condition: stable for discharge home  Patient presented with an acute illness with associated systemic symptoms and significant  discomfort requiring urgent management. In my opinion, this is a condition that a prudent lay person (someone who possesses an average knowledge of health and medicine) may potentially expect to result in complications if not addressed urgently such as respiratory distress, impairment of bodily function or dysfunction of bodily organs.   Routine symptom specific, illness specific and/or disease specific instructions were discussed with the patient and/or caregiver at length.   As such, the patient has been evaluated and assessed, work-up was performed and treatment was provided in alignment with urgent care protocols and evidence based medicine.  Patient/parent/caregiver has been advised that the patient may require follow up for further testing and treatment if the symptoms continue in spite of treatment, as clinically indicated and appropriate.  Patient/parent/caregiver has been advised to return to the [facility] or PCP if no better; to PCP or the Emergency Department if new signs and symptoms develop, or if the current signs or symptoms continue to change or  worsen for further workup, evaluation and treatment as clinically indicated and appropriate  The patient will follow up with their current PCP if and as advised. If the patient does not currently have a PCP we will assist them in obtaining one.   The patient may need specialty follow up if the symptoms continue, in spite of conservative treatment and management, for further workup, evaluation, consultation and treatment as clinically indicated and appropriate.   Patient/parent/caregiver verbalized understanding and agreement of plan as discussed.  All questions were addressed during visit.  Please see discharge instructions below for further details of plan.  This office note has been dictated using Teaching laboratory technician.  Unfortunately, this method of dictation can sometimes lead to typographical or grammatical errors.  I apologize for  your inconvenience in advance if this occurs.  Please do not hesitate to reach out to me if clarification is needed.      Theadora Rama Scales, PA-C 02/20/22 1500

## 2022-02-20 NOTE — ED Triage Notes (Signed)
The patient c/o swelling and bruising to left eye. The patient states the pain started Monday. The patient denies vision changes.   Home interventions: tylenol sinus

## 2022-02-20 NOTE — Discharge Instructions (Addendum)
I have enclosed information about styes and preseptal cellulitis that I hope you find helpful.  Styes are managed by applying warm compresses 3-4 times daily for 20 minutes to help encourage drainage and soothe inflammation.  Preseptal cellulitis is managed with antibiotics.  After consulting guidelines, because you have a hordeolum in place, is important that antibiotic you take also provides coverage of MRSA.  Bactrim provides good coverage of MRSA along with other opportunistic bacteria that could possibly be causing the infection around your lower eyelid at this time.  Please take 2 tablets twice daily for the next 7 days.  If you have not had resolution of your redness and swelling after completing 7 days of Bactrim or if you find symptoms are getting worse before you finish Bactrim, please return for repeat evaluation or consider going to the emergency room if you are having vision changes or worsening eye pain.  Thank you for visiting urgent care today.

## 2022-02-24 ENCOUNTER — Telehealth: Payer: Commercial Managed Care - PPO | Admitting: Nurse Practitioner

## 2022-02-24 DIAGNOSIS — H00014 Hordeolum externum left upper eyelid: Secondary | ICD-10-CM

## 2022-02-24 DIAGNOSIS — R110 Nausea: Secondary | ICD-10-CM

## 2022-02-24 MED ORDER — POLYMYXIN B-TRIMETHOPRIM 10000-0.1 UNIT/ML-% OP SOLN: 1.0000 [drp] | OPHTHALMIC | 0 refills | Status: AC

## 2022-02-24 MED ORDER — ONDANSETRON HCL 4 MG PO TABS: 4.0000 mg | ORAL_TABLET | Freq: Three times a day (TID) | ORAL | 0 refills | Status: DC | PRN

## 2022-02-24 NOTE — Progress Notes (Signed)
Virtual Visit Consent   Andrea Oneal, you are scheduled for a virtual visit with a  provider today. Just as with appointments in the office, your consent must be obtained to participate. Your consent will be active for this visit and any virtual visit you may have with one of our providers in the next 365 days. If you have a MyChart account, a copy of this consent can be sent to you electronically.  As this is a virtual visit, video technology does not allow for your provider to perform a traditional examination. This may limit your provider's ability to fully assess your condition. If your provider identifies any concerns that need to be evaluated in person or the need to arrange testing (such as labs, EKG, etc.), we will make arrangements to do so. Although advances in technology are sophisticated, we cannot ensure that it will always work on either your end or our end. If the connection with a video visit is poor, the visit may have to be switched to a telephone visit. With either a video or telephone visit, we are not always able to ensure that we have a secure connection.  By engaging in this virtual visit, you consent to the provision of healthcare and authorize for your insurance to be billed (if applicable) for the services provided during this visit. Depending on your insurance coverage, you may receive a charge related to this service.  I need to obtain your verbal consent now. Are you willing to proceed with your visit today? [name] has provided verbal consent on 02/24/2022 for a virtual visit (video or telephone). [name], FNP  Date: 02/24/2022 12:21 PM  Virtual Visit via Video Note   I, [name], connected with  Andrea Oneal  ([id number], 21-Jun-1960) on 02/24/22 at 12:45 PM EDT by a video-enabled telemedicine application and verified that I am speaking with the correct person using two identifiers.  Location: Patient: Virtual Visit Location Patient:  Home Provider: Virtual Visit Location Provider: Home Office   I discussed the limitations of evaluation and management by telemedicine and the availability of in person appointments. The patient expressed understanding and agreed to proceed.    History of Present Illness: Andrea Oneal is a 61 y.o. who identifies as a female who was assigned female at birth, and is being seen today with complaints of a fever with nausea this morning. She has diarrhea as well.  Her fever is low grade She is currently on Bactrim (oral) for a stye that she has been on for the past week   She ate a bojangles yesterday   No other household members are sick  Problems:  Patient Active Problem List   Diagnosis Date Noted   Hyperlipidemia 09/18/2021   Thyroid nodule 07/27/2020   Vertigo 08/10/2019   Hypertension 04/01/2019   Eczema 03/05/2018   Hyperglycemia 03/05/2018   Internal hemorrhoid 03/18/2017   IBS (irritable bowel syndrome) 03/18/2017   Anxiety 07/01/2016   Depression 07/01/2016   Allergic rhinitis 07/01/2016   Migraines 07/01/2016   Obese 07/01/2016    Allergies:  Allergies  Allergen Reactions   Beef (Bovine) Protein    Medications:  Current Outpatient Medications:    ALPRAZolam (XANAX) 0.5 MG tablet, TAKE 1 TABLET (0.5 MG TOTAL) BY MOUTH AT BEDTIME. NEED OFFICE VISIT FOR MORE REFILLS., Disp: 15 tablet, Rfl: 0   loratadine (CLARITIN) 10 MG tablet, Take 1 tablet (10 mg total) by mouth daily., Disp: 30 tablet, Rfl: 11  losartan-hydrochlorothiazide (HYZAAR) 100-25 MG tablet, TAKE 1 TABLET BY MOUTH EVERY DAY, Disp: 90 tablet, Rfl: 1   meloxicam (MOBIC) 7.5 MG tablet, Take 1 Tablet By Mouth Every 12 Hours prn, Disp: 60 tablet, Rfl: 5   Multiple Vitamin (MULTIVITAMIN WITH MINERALS) TABS tablet, Take 1 tablet by mouth daily., Disp: , Rfl:    ondansetron (ZOFRAN-ODT) 4 MG disintegrating tablet, Take 1 tablet (4 mg total) by mouth every 8 (eight) hours as needed for nausea or vomiting., Disp: 20  tablet, Rfl: 0   potassium chloride SA (KLOR-CON M20) 20 MEQ tablet, Take 1 tablet (20 mEq total) by mouth 2 (two) times daily., Disp: 180 tablet, Rfl: 0   sulfamethoxazole-trimethoprim (BACTRIM DS) 800-160 MG tablet, Take 2 tablets by mouth 2 (two) times daily for 7 days., Disp: 28 tablet, Rfl: 0   SUMAtriptan (IMITREX) 50 MG tablet, Take 1 tablet (50 mg total) by mouth once as needed for up to 1 dose for migraine. May repeat in 2 hours if headache persists or recurs., Disp: 10 tablet, Rfl: 5   triamcinolone (KENALOG) 0.1 %, Apply 1 application topically 2 (two) times daily as needed (eczema)., Disp: 454 g, Rfl: 1   venlafaxine XR (EFFEXOR-XR) 75 MG 24 hr capsule, Take 1 capsule (75 mg total) by mouth daily with breakfast., Disp: 90 capsule, Rfl: 1  Observations/Objective: Patient is well-developed, well-nourished in no acute distress.  Resting comfortably  at home.  Head is normocephalic, atraumatic.  No labored breathing.  Speech is clear and coherent with logical content.  Patient is alert and oriented at baseline.    Assessment and Plan: 1. Nausea  - ondansetron (ZOFRAN) 4 MG tablet; Take 1 tablet (4 mg total) by mouth every 8 (eight) hours as needed for nausea or vomiting.  Dispense: 20 tablet; Refill: 0  2. Hordeolum externum of left upper eyelid If oral antibiotics are no longer tolerated for treatment of stye start drops in place: - trimethoprim-polymyxin b (POLYTRIM) ophthalmic solution; Place 1 drop into the left eye every 4 (four) hours while awake for 5 days.  Dispense: 10 mL; Refill: 0  Follow Up Instructions: I discussed the assessment and treatment plan with the patient. The patient was provided an opportunity to ask questions and all were answered. The patient agreed with the plan and demonstrated an understanding of the instructions.  A copy of instructions were sent to the patient via MyChart unless otherwise noted below.   The patient was advised to call back or seek an  in-person evaluation if the symptoms worsen or if the condition fails to improve as anticipated.  Time:  I spent 10 minutes with the patient via telehealth technology discussing the above problems/concerns.    Apolonio Schneiders, FNP

## 2022-03-11 ENCOUNTER — Other Ambulatory Visit: Payer: Self-pay | Admitting: Internal Medicine

## 2022-03-16 ENCOUNTER — Encounter: Payer: Self-pay | Admitting: Internal Medicine

## 2022-03-16 NOTE — Patient Instructions (Addendum)
     Flu immunization administered today.     Blood work was ordered.   The lab is on the first floor.    Medications changes include :   restart amlodipine 5 mg daily     Return in about 6 months (around 09/15/2022) for Physical Exam.

## 2022-03-16 NOTE — Progress Notes (Unsigned)
Subjective:    Patient ID: Andrea Oneal, female    DOB: 1960/09/15, 61 y.o.   MRN: [id number]     HPI Andrea Oneal is here for follow up of her chronic medical problems, including htn, hld, migraines, hyperglycemia, anxiety, depression  Does not check BP at home - can tell when its high - maybe twice a month.   It feels like there is something in her right nostril and can not get it out.  Not painful.    Needs to exercise more.    Medications and allergies reviewed with patient and updated if appropriate.  Current Outpatient Medications on File Prior to Visit  Medication Sig Dispense Refill   ALPRAZolam (XANAX) 0.5 MG tablet TAKE 1 TABLET (0.5 MG TOTAL) BY MOUTH AT BEDTIME. NEED OFFICE VISIT FOR MORE REFILLS. 15 tablet 0   loratadine (CLARITIN) 10 MG tablet Take 1 tablet (10 mg total) by mouth daily. 30 tablet 11   losartan-hydrochlorothiazide (HYZAAR) 100-25 MG tablet TAKE 1 TABLET BY MOUTH EVERY DAY 90 tablet 1   meloxicam (MOBIC) 7.5 MG tablet Take 1 Tablet By Mouth Every 12 Hours prn 60 tablet 5   Multiple Vitamin (MULTIVITAMIN WITH MINERALS) TABS tablet Take 1 tablet by mouth daily.     ondansetron (ZOFRAN) 4 MG tablet Take 1 tablet (4 mg total) by mouth every 8 (eight) hours as needed for nausea or vomiting. 20 tablet 0   potassium chloride SA (KLOR-CON M20) 20 MEQ tablet Take 1 tablet (20 mEq total) by mouth 2 (two) times daily. 180 tablet 0   SUMAtriptan (IMITREX) 50 MG tablet Take 1 tablet (50 mg total) by mouth once as needed for up to 1 dose for migraine. May repeat in 2 hours if headache persists or recurs. 10 tablet 5   triamcinolone (KENALOG) 0.1 % Apply 1 application topically 2 (two) times daily as needed (eczema). 454 g 1   venlafaxine XR (EFFEXOR-XR) 75 MG 24 hr capsule Take 1 capsule (75 mg total) by mouth daily with breakfast. 90 capsule 1   No current facility-administered medications on file prior to visit.     Review of Systems  Constitutional:   Negative for chills and fever.  Respiratory:  Negative for cough, shortness of breath and wheezing.   Cardiovascular:  Negative for chest pain, palpitations and leg swelling.  Neurological:  Negative for light-headedness and headaches.       Objective:   Vitals:   03/17/22 1329 03/17/22 1350  BP: (!) 140/88 134/80  Pulse: 72   Temp: 98.2 F (36.8 C)   SpO2: 96%    BP Readings from Last 3 Encounters:  03/17/22 134/80  02/20/22 (!) 151/87  09/19/21 132/80   Wt Readings from Last 3 Encounters:  03/17/22 195 lb (88.5 kg)  09/19/21 198 lb (89.8 kg)  07/27/20 198 lb (89.8 kg)   Body mass index is 31.47 kg/m.    Physical Exam Constitutional:      General: She is not in acute distress.    Appearance: Normal appearance.  HENT:     Head: Normocephalic and atraumatic.     Nose:     Comments: Right nostril w/o anything concerning that I able to see Eyes:     Conjunctiva/sclera: Conjunctivae normal.  Cardiovascular:     Rate and Rhythm: Normal rate and regular rhythm.     Heart sounds: Normal heart sounds. No murmur heard. Pulmonary:     Effort: Pulmonary effort is normal. No respiratory  distress.     Breath sounds: Normal breath sounds. No wheezing.  Musculoskeletal:     Cervical back: Neck supple.     Right lower leg: No edema.     Left lower leg: No edema.  Lymphadenopathy:     Cervical: No cervical adenopathy.  Skin:    General: Skin is warm and dry.     Findings: No rash.  Neurological:     Mental Status: She is alert. Mental status is at baseline.  Psychiatric:        Mood and Affect: Mood normal.        Behavior: Behavior normal.        Lab Results  Component Value Date   WBC 5.6 09/19/2021   HGB 13.1 09/19/2021   HCT 39.7 09/19/2021   PLT 288.0 09/19/2021   GLUCOSE 107 (H) 09/19/2021   CHOL 215 (H) 09/19/2021   TRIG 111.0 09/19/2021   HDL 53.70 09/19/2021   LDLCALC 139 (H) 09/19/2021   ALT 32 09/19/2021   AST 25 09/19/2021   NA 138 09/19/2021    K 3.7 09/19/2021   CL 100 09/19/2021   CREATININE 0.71 09/19/2021   BUN 17 09/19/2021   CO2 30 09/19/2021   TSH 2.32 09/19/2021   HGBA1C 5.5 09/19/2021   The 10-year ASCVD risk score (Arnett DK, et al., 2019) is: 5.2%   Values used to calculate the score:     Age: 86 years     Sex: Female     Is Non-Hispanic African American: No     Diabetic: No     Tobacco smoker: No     Systolic Blood Pressure: 134 mmHg     Is BP treated: Yes     HDL Cholesterol: 53.7 mg/dL     Total Cholesterol: 215 mg/dL   Assessment & Plan:    See Problem List for Assessment and Plan of chronic medical problems.

## 2022-03-17 ENCOUNTER — Ambulatory Visit: Payer: Commercial Managed Care - PPO | Admitting: Internal Medicine

## 2022-03-17 ENCOUNTER — Encounter: Payer: Self-pay | Admitting: Internal Medicine

## 2022-03-17 VITALS — BP 134/80 | HR 72 | Temp 98.2°F | Ht 66.0 in | Wt 195.0 lb

## 2022-03-17 DIAGNOSIS — R739 Hyperglycemia, unspecified: Secondary | ICD-10-CM | POA: Diagnosis not present

## 2022-03-17 DIAGNOSIS — J349 Unspecified disorder of nose and nasal sinuses: Secondary | ICD-10-CM

## 2022-03-17 DIAGNOSIS — E782 Mixed hyperlipidemia: Secondary | ICD-10-CM | POA: Diagnosis not present

## 2022-03-17 DIAGNOSIS — Z23 Encounter for immunization: Secondary | ICD-10-CM | POA: Diagnosis not present

## 2022-03-17 DIAGNOSIS — F419 Anxiety disorder, unspecified: Secondary | ICD-10-CM

## 2022-03-17 DIAGNOSIS — F3289 Other specified depressive episodes: Secondary | ICD-10-CM

## 2022-03-17 DIAGNOSIS — I10 Essential (primary) hypertension: Secondary | ICD-10-CM | POA: Diagnosis not present

## 2022-03-17 DIAGNOSIS — G43809 Other migraine, not intractable, without status migrainosus: Secondary | ICD-10-CM

## 2022-03-17 LAB — HEMOGLOBIN A1C: Hgb A1c MFr Bld: 5.8 % (ref 4.6–6.5)

## 2022-03-17 LAB — COMPREHENSIVE METABOLIC PANEL
ALT: 40 U/L — ABNORMAL HIGH (ref 0–35)
AST: 30 U/L (ref 0–37)
Albumin: 4.5 g/dL (ref 3.5–5.2)
Alkaline Phosphatase: 78 U/L (ref 39–117)
BUN: 13 mg/dL (ref 6–23)
CO2: 28 mEq/L (ref 19–32)
Calcium: 9.4 mg/dL (ref 8.4–10.5)
Chloride: 100 mEq/L (ref 96–112)
Creatinine, Ser: 0.65 mg/dL (ref 0.40–1.20)
GFR: 95.42 mL/min (ref >60.00)
Glucose, Bld: 90 mg/dL (ref 70–99)
Potassium: 4.1 mEq/L (ref 3.5–5.1)
Sodium: 139 mEq/L (ref 135–145)
Total Bilirubin: 0.5 mg/dL (ref 0.2–1.2)
Total Protein: 7.5 g/dL (ref 6.0–8.3)

## 2022-03-17 LAB — LIPID PANEL
Cholesterol: 199 mg/dL (ref 0–200)
HDL: 49.8 mg/dL (ref >39.00)
LDL Cholesterol: 116 mg/dL — ABNORMAL HIGH (ref 0–99)
NonHDL: 149.03
Total CHOL/HDL Ratio: 4
Triglycerides: 165 mg/dL — ABNORMAL HIGH (ref 0.0–149.0)
VLDL: 33 mg/dL (ref 0.0–40.0)

## 2022-03-17 MED ORDER — AMLODIPINE BESYLATE 5 MG PO TABS: 5.0000 mg | ORAL_TABLET | Freq: Every day | ORAL | 2 refills | Status: DC

## 2022-03-17 NOTE — Assessment & Plan Note (Signed)
Chronic Check a1c Low sugar / carb diet Stressed regular exercise  

## 2022-03-17 NOTE — Assessment & Plan Note (Signed)
Chronic Regular exercise and healthy diet encouraged Check lipid panel  Continue lifestyle control 

## 2022-03-17 NOTE — Assessment & Plan Note (Signed)
Acute For 3 months has a feeling of something in right nostril - no pain I am not able to see anything on exam If not resolving will refer to ENT for evaluation

## 2022-03-17 NOTE — Assessment & Plan Note (Addendum)
Chronic Controlled, Stable Continue Effexor 75 mg daily, alprazolam 0.5 mg daily as needed - takes every 2 weeks on average

## 2022-03-17 NOTE — Assessment & Plan Note (Addendum)
Chronic BP not well controlled Continue losartan-hydrochlorothiazide 100-25 mg daily, restart amlodipine 5 mg daily cmp

## 2022-03-17 NOTE — Assessment & Plan Note (Signed)
  Infrequent migraines Continue Imitrex 50 mg daily as needed, Zofran 4 mg as needed

## 2022-03-17 NOTE — Assessment & Plan Note (Signed)
Chronic Controlled, Stable Continue Effexor 75 mg daily 

## 2022-03-21 ENCOUNTER — Other Ambulatory Visit: Payer: Self-pay | Admitting: Internal Medicine

## 2022-03-29 ENCOUNTER — Other Ambulatory Visit: Payer: Self-pay | Admitting: Internal Medicine

## 2022-05-17 ENCOUNTER — Other Ambulatory Visit: Payer: Self-pay | Admitting: Internal Medicine

## 2022-05-19 MED ORDER — ALPRAZOLAM 0.5 MG PO TABS: 0.5000 mg | ORAL_TABLET | Freq: Every day | ORAL | 0 refills | Status: DC

## 2022-06-06 ENCOUNTER — Ambulatory Visit
Admission: RE | Admit: 2022-06-06 | Discharge: 2022-06-06 | Disposition: A | Discharge status: Home / Self Care | Payer: Commercial Managed Care - PPO | Source: Ambulatory Visit | Attending: Nurse Practitioner | Admitting: Nurse Practitioner

## 2022-06-06 VITALS — BP 145/86 | HR 76 | Temp 98.6°F | Resp 16 | Ht 66.0 in | Wt 198.0 lb

## 2022-06-06 DIAGNOSIS — L739 Follicular disorder, unspecified: Secondary | ICD-10-CM | POA: Diagnosis not present

## 2022-06-06 MED ORDER — DOXYCYCLINE HYCLATE 100 MG PO CAPS: 100.0000 mg | ORAL_CAPSULE | Freq: Two times a day (BID) | ORAL | 0 refills | Status: DC

## 2022-06-06 NOTE — Discharge Instructions (Signed)
Start doxycycline twice daily for 10 days Follow-up with your PCP in 2 to 3 days for recheck Please go to the emergency room if you have any worsening symptoms

## 2022-06-06 NOTE — ED Triage Notes (Signed)
Pt states that she has a abscess under her arms and the top of her head. X3 weeks

## 2022-06-06 NOTE — ED Provider Notes (Signed)
[facility]    CSN: [id number] Arrival date & time: 06/06/22  1420      History   Chief Complaint Chief Complaint  Patient presents with   Allergic Reaction    Bumps on head and under arm - Entered by patient    HPI Andrea Oneal is a 62 y.o. female presents for evaluation of bumps on her head and underarm.  Patient reports 2 weeks ago she had a couple of red painful bumps under her left axilla that seem to resolved on their own.  3 to 4 days ago she noticed 1 on the top of her head and now notices one developing and another area on her head as well.  States they are painful but denies any drainage, fevers, chills.  No history of MRSA.  She states she had no issue with this until she was treated for preseptal cellulitis back in October with Bactrim.  She has not used any OTC medications.  No other concerns at this time.   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      Home Medications    Prior to Admission medications   Medication Sig Start Date End Date Taking? Authorizing Provider  ALPRAZolam Duanne Moron) 0.5 MG tablet Take 1 tablet (0.5 mg total) by mouth at bedtime. Need office visit for more refills. 05/19/22  Yes Burns, Claudina Lick, MD  amLODipine (NORVASC) 5 MG tablet Take 1 tablet (5 mg total) by mouth daily. 03/17/22   Yes Burns, Claudina Lick, MD  doxycycline (VIBRAMYCIN) 100 MG capsule Take 1 capsule (100 mg total) by mouth 2 (two) times daily. 06/06/22  Yes Melynda Ripple, NP  loratadine (CLARITIN) 10 MG tablet Take 1 tablet (10 mg total) by mouth daily. 08/10/19  Yes Burns, Claudina Lick, MD  losartan-hydrochlorothiazide (HYZAAR) 100-25 MG tablet TAKE 1 TABLET BY MOUTH EVERY DAY 03/11/22  Yes Burns, Claudina Lick, MD  meloxicam (MOBIC) 7.5 MG tablet Take 1 Tablet By Mouth Every 12 Hours prn 09/19/21  Yes Burns, Claudina Lick, MD  Multiple Vitamin (MULTIVITAMIN WITH MINERALS) TABS tablet Take 1 tablet by mouth daily.   Yes [provider]  ondansetron (ZOFRAN) 4 MG tablet Take 1 tablet (4 mg total) by mouth every 8 (eight) hours as needed for nausea or vomiting. 02/24/22  Yes Apolonio Schneiders, FNP  potassium chloride SA (KLOR-CON M20) 20 MEQ tablet Take 1 tablet (20 mEq total) by mouth 2 (two) times daily. 02/03/22  Yes Burns, Claudina Lick, MD  SUMAtriptan (IMITREX) 50 MG tablet Take 1 tablet (50 mg total) by mouth once as needed for up to 1 dose for migraine. May repeat in 2 hours if headache persists or recurs. 10/10/20  Yes Burns, Claudina Lick, MD  triamcinolone (KENALOG) 0.1 %  Apply 1 application topically 2 (two) times daily as needed (eczema). 07/23/20  Yes Lavonna Monarch, MD  venlafaxine XR (EFFEXOR-XR) 75 MG 24 hr capsule Take 1 capsule (75 mg total) by mouth daily with breakfast. 09/19/21  Yes Burns, Claudina Lick, MD    Family History Family History  Problem Relation Age of Onset   Arthritis Mother    Diabetes Mother    Depression Mother    Arthritis Father    Heart disease Father    Hypertension Father    Arthritis Maternal Grandmother    Diabetes Maternal Grandfather    Arthritis Paternal Grandmother    Depression Brother     Social History Social History   Tobacco Use   Smoking status: Former    Packs/day: 0.25    Years: 16.00    Total pack years: 4.00    Types: Cigarettes    Start date: 11    Quit date: 1994     Years since quitting: 30.1   Smokeless tobacco: Never  Vaping Use   Vaping Use: Every day  Substance Use Topics   Alcohol use: Yes    Alcohol/week: 7.0 standard drinks of alcohol    Types: 7 Glasses of wine per week   Drug use: Yes    Types: Marijuana     Allergies   Beef (bovine) protein   Review of Systems Review of Systems  Skin:        Bumps on head     Physical Exam Triage Vital Signs ED Triage Vitals  Enc Vitals Group     BP 06/06/22 1437 (!) 145/86     Pulse Rate 06/06/22 1437 76     Resp 06/06/22 1437 16     Temp 06/06/22 1437 98.6 F (37 C)     Temp Source 06/06/22 1437 Oral     SpO2 06/06/22 1437 96 %     Weight 06/06/22 1436 198 lb (89.8 kg)     Height 06/06/22 1436 5' 6"$  (1.676 m)     Head Circumference --      Peak Flow --      Pain Score 06/06/22 1436 0     Pain Loc --      Pain Edu? --      Excl. in [place]? --    No data found.  Updated Vital Signs BP (!) 145/86 (BP Location: Right Arm)   Pulse 76   Temp 98.6 F (37 C) (Oral)   Resp 16   Ht 5' 6"$  (1.676 m)   Wt 198 lb (89.8 kg)   SpO2 96%   BMI 31.96 kg/m   Visual Acuity Right Eye Distance:   Left Eye Distance:   Bilateral Distance:    Right Eye Near:   Left Eye Near:    Bilateral Near:     Physical Exam Vitals and nursing note reviewed.  Constitutional:      Appearance: Normal appearance.  HENT:     Head: Normocephalic and atraumatic.   Eyes:     Pupils: Pupils are equal, round, and reactive to light.  Cardiovascular:     Rate and Rhythm: Normal rate.  Pulmonary:     Effort: Pulmonary effort is normal.  Skin:    General: Skin is warm and dry.  Neurological:     General: No focal deficit present.     Mental Status: She is alert and oriented to person, place, and time.  Psychiatric:        Mood and Affect:  Mood normal.        Behavior: Behavior normal.      UC Treatments / Results  Labs (all labs ordered are listed, but only abnormal results are displayed) Labs  Reviewed - No data to display  EKG   Radiology No results found.  Procedures Procedures (including critical care time)  Medications Ordered in UC Medications - No data to display  Initial Impression / Assessment and Plan / UC Course  I have reviewed the triage vital signs and the nursing notes.  Pertinent labs & imaging results that were available during my care of the patient were reviewed by me and considered in my medical decision making (see chart for details).     Reviewed exam and symptoms with patient. Will treat for folliculitis with doxycycline Advised PCP follow-up in 2 to 3 days for recheck ER precautions reviewed and patient verbalized understanding Final Clinical Impressions(s) / UC Diagnoses   Final diagnoses:  Folliculitis     Discharge Instructions      Start doxycycline twice daily for 10 days Follow-up with your PCP in 2 to 3 days for recheck Please go to the emergency room if you have any worsening symptoms   ED Prescriptions     Medication Sig Dispense Auth. Provider   doxycycline (VIBRAMYCIN) 100 MG capsule Take 1 capsule (100 mg total) by mouth 2 (two) times daily. 20 capsule Melynda Ripple, NP      PDMP not reviewed this encounter.   Melynda Ripple, NP 06/06/22 [phone]

## 2022-08-01 ENCOUNTER — Ambulatory Visit
Admission: EM | Admit: 2022-08-01 | Discharge: 2022-08-01 | Disposition: A | Discharge status: Home / Self Care | Payer: Commercial Managed Care - PPO | Attending: Physician Assistant | Admitting: Physician Assistant

## 2022-08-01 DIAGNOSIS — H5712 Ocular pain, left eye: Secondary | ICD-10-CM

## 2022-08-01 DIAGNOSIS — R112 Nausea with vomiting, unspecified: Secondary | ICD-10-CM | POA: Diagnosis not present

## 2022-08-01 DIAGNOSIS — S0502XA Injury of conjunctiva and corneal abrasion without foreign body, left eye, initial encounter: Secondary | ICD-10-CM

## 2022-08-01 MED ORDER — POLYMYXIN B-TRIMETHOPRIM 10000-0.1 UNIT/ML-% OP SOLN: 1.0000 [drp] | OPHTHALMIC | 0 refills | Status: DC

## 2022-08-01 MED ORDER — KETOROLAC TROMETHAMINE 0.5 % OP SOLN: 1.0000 [drp] | Freq: Four times a day (QID) | OPHTHALMIC | 0 refills | Status: AC

## 2022-08-01 MED ORDER — ONDANSETRON 4 MG PO TBDP
4.0000 mg | ORAL_TABLET | Freq: Once | ORAL | Status: AC
  Administered 2022-08-01: 4 mg via ORAL

## 2022-08-01 NOTE — ED Provider Notes (Signed)
[facility]    CSN: [id number] Arrival date & time: 08/01/22  1402      History   Chief Complaint Chief Complaint  Patient presents with   Eye Problem    Swollen shut and painful - Entered by patient    HPI Andrea Oneal is a 62 y.o. female.   62 year old female presents with left eye pain.  Patient indicates that she woke up this morning having some allergies with some watery itchy eyes, and she relates she used her normal allergy eyedrops.  She indicates shortly after she started having left pain, redness, irritation, photophobia.  Patient indicates that the discomfort has continued over the past several hours.  Patient indicates that she does not recall injuring the left eye, getting any objects in the left eye or trauma to the left.  She indicates that she just started having nausea with the pain over the past hour.  She is without fever, chills, cough, congestion.  Tolerating fluids well.   Eye Problem Associated symptoms: photophobia (left eye)     Past Medical History:  Diagnosis Date   Arthritis    Depression    Hypertension    Migraine     Patient Active Problem List   Diagnosis Date Noted   Nose disorder 03/17/2022   Hyperlipidemia 09/18/2021   Thyroid nodule 07/27/2020   Vertigo 08/10/2019   Hypertension 04/01/2019   Eczema 03/05/2018   Prediabetes 03/05/2018   Internal hemorrhoid 03/18/2017   IBS (irritable bowel syndrome) 03/18/2017   Anxiety 07/01/2016   Depression 07/01/2016   Allergic rhinitis 07/01/2016   Migraines 07/01/2016   Obese 07/01/2016    Past Surgical History:  Procedure Laterality Date   APPENDECTOMY     TONSILLECTOMY  1986    OB History   No obstetric history on file.      Home Medications    Prior to Admission medications   Medication Sig Start Date End Date Taking? Authorizing Provider  ketorolac (ACULAR) 0.5 % ophthalmic solution Place 1 drop into both eyes every 6 (six) hours. 08/01/22  Yes Ellsworth LennoxJames, Anye Brose,  PA-C  trimethoprim-polymyxin b (POLYTRIM) ophthalmic solution Place 1 drop into the left eye every 4 (four) hours. 08/01/22  Yes Ellsworth LennoxJames, Myrth Dahan, PA-C  ALPRAZolam Prudy Feeler(XANAX) 0.5 MG tablet Take 1 tablet (0.5 mg total) by mouth at bedtime. Need office visit for more refills. 05/19/22   Pincus SanesBurns, Stacy J, MD  amLODipine (NORVASC) 5 MG tablet Take 1 tablet (5 mg total) by mouth daily. 03/17/22   Pincus SanesBurns, Stacy J, MD  doxycycline (VIBRAMYCIN) 100 MG capsule Take 1 capsule (100 mg total) by mouth 2 (two) times daily. 06/06/22   Radford PaxMayer, Jodi R, NP  loratadine (CLARITIN) 10 MG tablet Take 1 tablet (10 mg total) by mouth daily. 08/10/19   Pincus SanesBurns, Stacy J, MD  losartan-hydrochlorothiazide (HYZAAR) 100-25 MG tablet TAKE 1 TABLET BY MOUTH EVERY DAY 03/11/22   Pincus SanesBurns, Stacy J, MD  meloxicam (MOBIC) 7.5 MG tablet Take 1 Tablet By Mouth Every 12 Hours prn 09/19/21   Pincus SanesBurns, Stacy J, MD  Multiple Vitamin (MULTIVITAMIN WITH MINERALS) TABS tablet Take 1 tablet by mouth daily.    [provider]  ondansetron (ZOFRAN) 4 MG tablet Take 1 tablet (4 mg total) by mouth every 8 (eight) hours as needed for nausea or vomiting. 02/24/22   Viviano SimasParker, Sarah, FNP  potassium chloride SA (KLOR-CON M20) 20 MEQ tablet Take 1 tablet (20 mEq total) by mouth 2 (two) times daily. 02/03/22   Burns,  Bobette Mo, MD  SUMAtriptan (IMITREX) 50 MG tablet Take 1 tablet (50 mg total) by mouth once as needed for up to 1 dose for migraine. May repeat in 2 hours if headache persists or recurs. 10/10/20   Pincus Sanes, MD  triamcinolone (KENALOG) 0.1 % Apply 1 application topically 2 (two) times daily as needed (eczema). 07/23/20   Janalyn Harder, MD  venlafaxine XR (EFFEXOR-XR) 75 MG 24 hr capsule Take 1 capsule (75 mg total) by mouth daily with breakfast. 09/19/21   Lawerance Bach, Bobette Mo, MD    Family History Family History  Problem Relation Age of Onset   Arthritis Mother    Diabetes Mother    Depression Mother    Arthritis Father    Heart disease Father     Hypertension Father    Arthritis Maternal Grandmother    Diabetes Maternal Grandfather    Arthritis Paternal Grandmother    Depression Brother     Social History Social History   Tobacco Use   Smoking status: Former    Packs/day: 0.25    Years: 16.00    Additional pack years: 0.00    Total pack years: 4.00    Types: Cigarettes    Start date: 20    Quit date: 1994    Years since quitting: 30.2   Smokeless tobacco: Never  Vaping Use   Vaping Use: Every day  Substance Use Topics   Alcohol use: Yes    Alcohol/week: 7.0 standard drinks of alcohol    Types: 7 Glasses of wine per week   Drug use: Yes    Types: Marijuana     Allergies   Beef (bovine) protein   Review of Systems Review of Systems  Eyes:  Positive for photophobia (left eye) and pain (left eye).     Physical Exam Triage Vital Signs ED Triage Vitals  Enc Vitals Group     BP 08/01/22 1413 (!) 176/106     Pulse Rate 08/01/22 1413 74     Resp 08/01/22 1413 16     Temp 08/01/22 1413 97.9 F (36.6 C)     Temp Source 08/01/22 1413 Oral     SpO2 08/01/22 1413 98 %     Weight --      Height --      Head Circumference --      Peak Flow --      Pain Score 08/01/22 1412 5     Pain Loc --      Pain Edu? --      Excl. in [place]? --    No data found.  Updated Vital Signs BP (!) 176/106   Pulse 74   Temp 97.9 F (36.6 C) (Oral)   Resp 16   SpO2 98%   Visual Acuity Right Eye Distance:   Left Eye Distance:   Bilateral Distance:    Right Eye Near:   Left Eye Near:    Bilateral Near:     Physical Exam Constitutional:      Appearance: Normal appearance.  HENT:     Right Ear: Tympanic membrane and ear canal normal.     Left Ear: Tympanic membrane and ear canal normal.     Mouth/Throat:     Mouth: Mucous membranes are moist.     Pharynx: Oropharynx is clear.  Eyes:      Comments: Eyes: Right eye and lid are normal. Left eye, conjunctival redness present, difficult for patient to open the eye  due to photo  sensitivity, no unusual swelling of the lids.  Flora stain: There is a corneal abrasion that is half moon shaped that is the middle part of the pupil to the iris 1 mm each side.  No foreign bodies, no other abnormalities noted.  Neurological:     Mental Status: She is alert.      UC Treatments / Results  Labs (all labs ordered are listed, but only abnormal results are displayed) Labs Reviewed - No data to display  EKG   Radiology No results found.  Procedures Procedures (including critical care time)  Medications Ordered in UC Medications  ondansetron (ZOFRAN-ODT) disintegrating tablet 4 mg (4 mg Oral Given 08/01/22 1421)    Initial Impression / Assessment and Plan / UC Course  I have reviewed the triage vital signs and the nursing notes.  Pertinent labs & imaging results that were available during my care of the patient were reviewed by me and considered in my medical decision making (see chart for details).    Plan: The diagnosis will be treated with the following: 1.  Left eye pain: A.  Acular 0.5% ophthalmic solution, 1 drop every 6 hours to help relieve pain and discomfort. 2.  "Abrasion left eye: A.  Polytrim ophthalmic solution, 1 drop every 6 hours until condition clears. 3.  Nausea with vomiting: A.  Zofran 4 mg given in the office today. 4.  Patient advised if symptoms do not resolve over the next 48 to 72 hours or worsen then [facility] for an appointment to be seen and evaluated or to the emergency room. Final Clinical Impressions(s) / UC Diagnoses   Final diagnoses:  Nausea and vomiting, unspecified vomiting type  Left eye pain  Abrasion of left cornea, initial encounter     Discharge Instructions      Advised to use the Polytrim eyedrops, 1 drop every 4-6 hours on a regular basis for the next couple days until the corneal abrasion heals. Advised to use the Acular eyedrops, 1 drop every 6 hours to help reduce pain  from the corneal abrasion.  Advise if symptoms do not resolve within the next 48 to 72 hours then [facility] for [facility].  Follow-up PCP return to urgent care as needed.     ED Prescriptions     Medication Sig Dispense Auth. Provider   ketorolac (ACULAR) 0.5 % ophthalmic solution Place 1 drop into both eyes every 6 (six) hours. 5 mL Ellsworth LennoxJames, Shandel Busic, PA-C   trimethoprim-polymyxin b (POLYTRIM) ophthalmic solution Place 1 drop into the left eye every 4 (four) hours. 10 mL Ellsworth LennoxJames, Angelina Venard, PA-C      PDMP not reviewed this encounter.   Ellsworth LennoxJames, Rayne Loiseau, PA-C 08/01/22 1448

## 2022-08-01 NOTE — ED Triage Notes (Signed)
Pt presents to uc with co of left eye swelling and drainage since this morning and new onset of nausea 30 min ago.

## 2022-08-01 NOTE — Discharge Instructions (Signed)
Advised to use the Polytrim eyedrops, 1 drop every 4-6 hours on a regular basis for the next couple days until the corneal abrasion heals. Advised to use the Acular eyedrops, 1 drop every 6 hours to help reduce pain from the corneal abrasion.  Advise if symptoms do not resolve within the next 48 to 72 hours then contact [facility] ophthalmology for evaluation.  Follow-up PCP return to urgent care as needed.

## 2022-08-05 ENCOUNTER — Other Ambulatory Visit: Payer: Self-pay | Admitting: Internal Medicine

## 2022-08-06 ENCOUNTER — Other Ambulatory Visit: Payer: Self-pay | Admitting: Internal Medicine

## 2022-09-18 ENCOUNTER — Other Ambulatory Visit: Payer: Self-pay | Admitting: Internal Medicine

## 2022-09-19 ENCOUNTER — Other Ambulatory Visit: Payer: Self-pay | Admitting: Internal Medicine

## 2022-09-19 DIAGNOSIS — Z1231 Encounter for screening mammogram for malignant neoplasm of breast: Secondary | ICD-10-CM

## 2022-09-22 ENCOUNTER — Encounter: Payer: Self-pay | Admitting: Internal Medicine

## 2022-09-22 NOTE — Progress Notes (Unsigned)
Subjective:    Patient ID: Andrea Oneal, female    DOB: Dec 09, 1960, 62 y.o.   MRN: [id number]      HPI Andrea Oneal is here for a Physical exam and her chronic medical problems.    Doing well.  Has no concerns.   Medications and allergies reviewed with patient and updated if appropriate.  Current Outpatient Medications on File Prior to Visit  Medication Sig Dispense Refill   ketorolac (ACULAR) 0.5 % ophthalmic solution Place 1 drop into both eyes every 6 (six) hours. 5 mL 0   KLOR-CON M20 20 MEQ tablet TAKE 1 TABLET BY MOUTH TWICE A DAY 180 tablet 0   loratadine (CLARITIN) 10 MG tablet Take 1 tablet (10 mg total) by mouth daily. 30 tablet 11   losartan-hydrochlorothiazide (HYZAAR) 100-25 MG tablet TAKE 1 TABLET BY MOUTH EVERY DAY 90 tablet 1   meloxicam (MOBIC) 7.5 MG tablet Take 1 Tablet By Mouth Every 12 Hours prn 60 tablet 5   Multiple Vitamin (MULTIVITAMIN WITH MINERALS) TABS tablet Take 1 tablet by mouth daily.     ondansetron (ZOFRAN) 4 MG tablet Take 1 tablet (4 mg total) by mouth every 8 (eight) hours as needed for nausea or vomiting. 20 tablet 0   SUMAtriptan (IMITREX) 50 MG tablet Take 1 tablet (50 mg total) by mouth once as needed for up to 1 dose for migraine. May repeat in 2 hours if headache persists or recurs. 10 tablet 5   triamcinolone (KENALOG) 0.1 % Apply 1 application topically 2 (two) times daily as needed (eczema). 454 g 1   venlafaxine XR (EFFEXOR-XR) 75 MG 24 hr capsule TAKE 1 CAPSULE BY MOUTH DAILY WITH BREAKFAST. 90 capsule 1   No current facility-administered medications on file prior to visit.    Review of Systems  Constitutional:  Negative for fever.  Eyes:  Negative for visual disturbance.  Respiratory:  Negative for cough, shortness of breath and wheezing.   Cardiovascular:  Negative for chest pain, palpitations and leg swelling.  Gastrointestinal:  Negative for abdominal pain, blood in stool, constipation and diarrhea.       No gerd   Genitourinary:  Negative for dysuria.  Musculoskeletal:  Positive for arthralgias. Negative for back pain.  Skin:  Negative for rash.  Neurological:  Negative for light-headedness and headaches.  Psychiatric/Behavioral:  Negative for dysphoric mood. The patient is not nervous/anxious.        Objective:   Vitals:   09/23/22 0827  BP: 130/80  Pulse: 73  Temp: 98 F (36.7 C)  SpO2: 96%   Filed Weights   09/23/22 0827  Weight: 197 lb 8 oz (89.6 kg)   Body mass index is 31.88 kg/m.  BP Readings from Last 3 Encounters:  09/23/22 130/80  08/01/22 (!) 176/106  06/06/22 (!) 145/86    Wt Readings from Last 3 Encounters:  09/23/22 197 lb 8 oz (89.6 kg)  06/06/22 198 lb (89.8 kg)  03/17/22 195 lb (88.5 kg)       Physical Exam Constitutional: She appears well-developed and well-nourished. No distress.  HENT:  Head: Normocephalic and atraumatic.  Right Ear: External ear normal. Normal ear canal and TM Left Ear: External ear normal.  Normal ear canal and TM Mouth/Throat: Oropharynx is clear and moist.  Eyes: Conjunctivae normal.  Neck: Neck supple. No tracheal deviation present. No thyromegaly present.  No carotid bruit  Cardiovascular: Normal rate, regular rhythm and normal heart sounds.   No murmur heard.  No edema.  Pulmonary/Chest: Effort normal and breath sounds normal. No respiratory distress. She has no wheezes. She has no rales.  Breast: deferred   Abdominal: Soft. She exhibits no distension. There is no tenderness.  Lymphadenopathy: She has no cervical adenopathy.  Skin: Skin is warm and dry. She is not diaphoretic.  Psychiatric: She has a normal mood and affect. Her behavior is normal.     Lab Results  Component Value Date   WBC 5.6 09/19/2021   HGB 13.1 09/19/2021   HCT 39.7 09/19/2021   PLT 288.0 09/19/2021   GLUCOSE 90 03/17/2022   CHOL 199 03/17/2022   TRIG 165.0 (H) 03/17/2022   HDL 49.80 03/17/2022   LDLCALC 116 (H) 03/17/2022   ALT 40 (H)  03/17/2022   AST 30 03/17/2022   NA 139 03/17/2022   K 4.1 03/17/2022   CL 100 03/17/2022   CREATININE 0.65 03/17/2022   BUN 13 03/17/2022   CO2 28 03/17/2022   TSH 2.32 09/19/2021   HGBA1C 5.8 03/17/2022    The 10-year ASCVD risk score (Arnett DK, et al., 2019) is: 5.4%   Values used to calculate the score:     Age: 51 years     Sex: Female     Is Non-Hispanic African American: No     Diabetic: No     Tobacco smoker: No     Systolic Blood Pressure: 130 mmHg     Is BP treated: Yes     HDL Cholesterol: 49.8 mg/dL     Total Cholesterol: 199 mg/dL      Assessment & Plan:   Physical exam: Screening blood work  ordered Exercise-not regular-stressed regular exercise Weight  obese - working on weight loss Substance abuse  none   Reviewed recommended immunizations.   Health Maintenance  Topic Date Due   COVID-19 Vaccine (4 - 2023-24 season) 10/09/2022 (Originally 12/27/2021)   Colonoscopy  11/24/2022 (Originally 05/15/2006)   Zoster Vaccines- Shingrix (1 of 2) 12/24/2022 (Originally 05/15/1980)   INFLUENZA VACCINE  11/27/2022   MAMMOGRAM  08/01/2023   PAP SMEAR-Modifier  05/02/2024   DTaP/Tdap/Td (2 - Td or Tdap) 03/31/2029   Hepatitis C Screening  Completed   HIV Screening  Completed   HPV VACCINES  Aged Out      Last colonoscopy 11/23/2012 in [place].  She has tried to get the report, but has not been able to.  She knows she did have a polyp, but it was considered benign.  Due this year-referral order    See Problem List for Assessment and Plan of chronic medical problems.

## 2022-09-22 NOTE — Patient Instructions (Addendum)
Blood work was ordered.   The lab is on the first floor.    Medications changes include :   none     Return in about 6 months (around 03/26/2023) for follow up.   Health Maintenance, Female Adopting a healthy lifestyle and getting preventive care are important in promoting health and wellness. Ask your health care provider about: The right schedule for you to have regular tests and exams. Things you can do on your own to prevent diseases and keep yourself healthy. What should I know about diet, weight, and exercise? Eat a healthy diet  Eat a diet that includes plenty of vegetables, fruits, low-fat dairy products, and lean protein. Do not eat a lot of foods that are high in solid fats, added sugars, or sodium. Maintain a healthy weight Body mass index (BMI) is used to identify weight problems. It estimates body fat based on height and weight. Your health care provider can help determine your BMI and help you achieve or maintain a healthy weight. Get regular exercise Get regular exercise. This is one of the most important things you can do for your health. Most adults should: Exercise for at least 150 minutes each week. The exercise should increase your heart rate and make you sweat (moderate-intensity exercise). Do strengthening exercises at least twice a week. This is in addition to the moderate-intensity exercise. Spend less time sitting. Even light physical activity can be beneficial. Watch cholesterol and blood lipids Have your blood tested for lipids and cholesterol at 61 years of age, then have this test every 5 years. Have your cholesterol levels checked more often if: Your lipid or cholesterol levels are high. You are older than 61 years of age. You are at high risk for heart disease. What should I know about cancer screening? Depending on your health history and family history, you may need to have cancer screening at various ages. This may include screening  for: Breast cancer. Cervical cancer. Colorectal cancer. Skin cancer. Lung cancer. What should I know about heart disease, diabetes, and high blood pressure? Blood pressure and heart disease High blood pressure causes heart disease and increases the risk of stroke. This is more likely to develop in people who have high blood pressure readings or are overweight. Have your blood pressure checked: Every 3-5 years if you are 26-13 years of age. Every year if you are 34 years old or older. Diabetes Have regular diabetes screenings. This checks your fasting blood sugar level. Have the screening done: Once every three years after age 84 if you are at a normal weight and have a low risk for diabetes. More often and at a younger age if you are overweight or have a high risk for diabetes. What should I know about preventing infection? Hepatitis B If you have a higher risk for hepatitis B, you should be screened for this virus. Talk with your health care provider to find out if you are at risk for hepatitis B infection. Hepatitis C Testing is recommended for: Everyone born from [birth year] through 1965. Anyone with known risk factors for hepatitis C. Sexually transmitted infections (STIs) Get screened for STIs, including gonorrhea and chlamydia, if: You are sexually active and are younger than 61 years of age. You are older than 61 years of age and your health care provider tells you that you are at risk for this type of infection. Your sexual activity has changed since you were last screened, and you are at  increased risk for chlamydia or gonorrhea. Ask your health care provider if you are at risk. Ask your health care provider about whether you are at high risk for HIV. Your health care provider may recommend a prescription medicine to help prevent HIV infection. If you choose to take medicine to prevent HIV, you should first get tested for HIV. You should then be tested every 3 months for as long as you  are taking the medicine. Pregnancy If you are about to stop having your period (premenopausal) and you may become pregnant, seek counseling before you get pregnant. Take 400 to 800 micrograms (mcg) of folic acid every day if you become pregnant. Ask for birth control (contraception) if you want to prevent pregnancy. Osteoporosis and menopause Osteoporosis is a disease in which the bones lose minerals and strength with aging. This can result in bone fractures. If you are 40 years old or older, or if you are at risk for osteoporosis and fractures, ask your health care provider if you should: Be screened for bone loss. Take a calcium or vitamin D supplement to lower your risk of fractures. Be given hormone replacement therapy (HRT) to treat symptoms of menopause. Follow these instructions at home: Alcohol use Do not drink alcohol if: Your health care provider tells you not to drink. You are pregnant, may be pregnant, or are planning to become pregnant. If you drink alcohol: Limit how much you have to: 0-1 drink a day. Know how much alcohol is in your drink. In the [place] one drink equals one 12 oz bottle of beer (355 mL), one 5 oz glass of wine (148 mL), or one 1 oz glass of hard liquor (44 mL). Lifestyle Do not use any products that contain nicotine or tobacco. These products include cigarettes, chewing tobacco, and vaping devices, such as e-cigarettes. If you need help quitting, ask your health care provider. Do not use street drugs. Do not share needles. Ask your health care provider for help if you need support or information about quitting drugs. General instructions Schedule regular health, dental, and eye exams. Stay current with your vaccines. Tell your health care provider if: You often feel depressed. You have ever been abused or do not feel safe at home. Summary Adopting a healthy lifestyle and getting preventive care are important in promoting health and wellness. Follow your  health care provider's instructions about healthy diet, exercising, and getting tested or screened for diseases. Follow your health care provider's instructions on monitoring your cholesterol and blood pressure. This information is not intended to replace advice given to you by your health care provider. Make sure you discuss any questions you have with your health care provider. Document Revised: 09/03/2020 Document Reviewed: 09/03/2020 Elsevier Patient Education  2024 [employer] Health Maintenance, Female Adopting a healthy lifestyle and getting preventive care are important in promoting health and wellness. Ask your health care provider about: The right schedule for you to have regular tests and exams. Things you can do on your own to prevent diseases and keep yourself healthy. What should I know about diet, weight, and exercise? Eat a healthy diet  Eat a diet that includes plenty of vegetables, fruits, low-fat dairy products, and lean protein. Do not eat a lot of foods that are high in solid fats, added sugars, or sodium. Maintain a healthy weight Body mass index (BMI) is used to identify weight problems. It estimates body fat based on height and weight. Your health care provider can help determine your  BMI and help you achieve or maintain a healthy weight. Get regular exercise Get regular exercise. This is one of the most important things you can do for your health. Most adults should: Exercise for at least 150 minutes each week. The exercise should increase your heart rate and make you sweat (moderate-intensity exercise). Do strengthening exercises at least twice a week. This is in addition to the moderate-intensity exercise. Spend less time sitting. Even light physical activity can be beneficial. Watch cholesterol and blood lipids Have your blood tested for lipids and cholesterol at 61 years of age, then have this test every 5 years. Have your cholesterol levels checked more often  if: Your lipid or cholesterol levels are high. You are older than 61 years of age. You are at high risk for heart disease. What should I know about cancer screening? Depending on your health history and family history, you may need to have cancer screening at various ages. This may include screening for: Breast cancer. Cervical cancer. Colorectal cancer. Skin cancer. Lung cancer. What should I know about heart disease, diabetes, and high blood pressure? Blood pressure and heart disease High blood pressure causes heart disease and increases the risk of stroke. This is more likely to develop in people who have high blood pressure readings or are overweight. Have your blood pressure checked: Every 3-5 years if you are 59-43 years of age. Every year if you are 55 years old or older. Diabetes Have regular diabetes screenings. This checks your fasting blood sugar level. Have the screening done: Once every three years after age 87 if you are at a normal weight and have a low risk for diabetes. More often and at a younger age if you are overweight or have a high risk for diabetes. What should I know about preventing infection? Hepatitis B If you have a higher risk for hepatitis B, you should be screened for this virus. Talk with your health care provider to find out if you are at risk for hepatitis B infection. Hepatitis C Testing is recommended for: Everyone born from [birth year] through 1965. Anyone with known risk factors for hepatitis C. Sexually transmitted infections (STIs) Get screened for STIs, including gonorrhea and chlamydia, if: You are sexually active and are younger than 61 years of age. You are older than 61 years of age and your health care provider tells you that you are at risk for this type of infection. Your sexual activity has changed since you were last screened, and you are at increased risk for chlamydia or gonorrhea. Ask your health care provider if you are at risk. Ask  your health care provider about whether you are at high risk for HIV. Your health care provider may recommend a prescription medicine to help prevent HIV infection. If you choose to take medicine to prevent HIV, you should first get tested for HIV. You should then be tested every 3 months for as long as you are taking the medicine. Pregnancy If you are about to stop having your period (premenopausal) and you may become pregnant, seek counseling before you get pregnant. Take 400 to 800 micrograms (mcg) of folic acid every day if you become pregnant. Ask for birth control (contraception) if you want to prevent pregnancy. Osteoporosis and menopause Osteoporosis is a disease in which the bones lose minerals and strength with aging. This can result in bone fractures. If you are 81 years old or older, or if you are at risk for osteoporosis and fractures, ask your  health care provider if you should: Be screened for bone loss. Take a calcium or vitamin D supplement to lower your risk of fractures. Be given hormone replacement therapy (HRT) to treat symptoms of menopause. Follow these instructions at home: Alcohol use Do not drink alcohol if: Your health care provider tells you not to drink. You are pregnant, may be pregnant, or are planning to become pregnant. If you drink alcohol: Limit how much you have to: 0-1 drink a day. Know how much alcohol is in your drink. In the [place] one drink equals one 12 oz bottle of beer (355 mL), one 5 oz glass of wine (148 mL), or one 1 oz glass of hard liquor (44 mL). Lifestyle Do not use any products that contain nicotine or tobacco. These products include cigarettes, chewing tobacco, and vaping devices, such as e-cigarettes. If you need help quitting, ask your health care provider. Do not use street drugs. Do not share needles. Ask your health care provider for help if you need support or information about quitting drugs. General instructions Schedule regular  health, dental, and eye exams. Stay current with your vaccines. Tell your health care provider if: You often feel depressed. You have ever been abused or do not feel safe at home. Summary Adopting a healthy lifestyle and getting preventive care are important in promoting health and wellness. Follow your health care provider's instructions about healthy diet, exercising, and getting tested or screened for diseases. Follow your health care provider's instructions on monitoring your cholesterol and blood pressure. This information is not intended to replace advice given to you by your health care provider. Make sure you discuss any questions you have with your health care provider. Document Revised: 09/03/2020 Document Reviewed: 09/03/2020 Elsevier Patient Education  2024 [employer].

## 2022-09-23 ENCOUNTER — Ambulatory Visit (INDEPENDENT_AMBULATORY_CARE_PROVIDER_SITE_OTHER): Payer: Commercial Managed Care - PPO | Admitting: Internal Medicine

## 2022-09-23 VITALS — BP 130/80 | HR 73 | Temp 98.0°F | Ht 66.0 in | Wt 197.5 lb

## 2022-09-23 DIAGNOSIS — R739 Hyperglycemia, unspecified: Secondary | ICD-10-CM | POA: Diagnosis not present

## 2022-09-23 DIAGNOSIS — R7303 Prediabetes: Secondary | ICD-10-CM

## 2022-09-23 DIAGNOSIS — Z0000 Encounter for general adult medical examination without abnormal findings: Secondary | ICD-10-CM

## 2022-09-23 DIAGNOSIS — F419 Anxiety disorder, unspecified: Secondary | ICD-10-CM | POA: Diagnosis not present

## 2022-09-23 DIAGNOSIS — I10 Essential (primary) hypertension: Secondary | ICD-10-CM

## 2022-09-23 DIAGNOSIS — E782 Mixed hyperlipidemia: Secondary | ICD-10-CM | POA: Diagnosis not present

## 2022-09-23 DIAGNOSIS — Z1211 Encounter for screening for malignant neoplasm of colon: Secondary | ICD-10-CM

## 2022-09-23 DIAGNOSIS — E6609 Other obesity due to excess calories: Secondary | ICD-10-CM

## 2022-09-23 DIAGNOSIS — Z6831 Body mass index (BMI) 31.0-31.9, adult: Secondary | ICD-10-CM

## 2022-09-23 DIAGNOSIS — F3289 Other specified depressive episodes: Secondary | ICD-10-CM

## 2022-09-23 DIAGNOSIS — G43809 Other migraine, not intractable, without status migrainosus: Secondary | ICD-10-CM

## 2022-09-23 LAB — LIPID PANEL
Cholesterol: 175 mg/dL (ref 0–200)
HDL: 44.8 mg/dL (ref >39.00)
LDL Cholesterol: 102 mg/dL — ABNORMAL HIGH (ref 0–99)
NonHDL: 130
Total CHOL/HDL Ratio: 4
Triglycerides: 142 mg/dL (ref 0.0–149.0)
VLDL: 28.4 mg/dL (ref 0.0–40.0)

## 2022-09-23 LAB — CBC WITH DIFFERENTIAL/PLATELET
Basophils Absolute: 0.1 10*3/uL (ref 0.0–0.1)
Basophils Relative: 1.2 % (ref 0.0–3.0)
Eosinophils Absolute: 0.4 10*3/uL (ref 0.0–0.7)
Eosinophils Relative: 7.2 % — ABNORMAL HIGH (ref 0.0–5.0)
HCT: 40.2 % (ref 36.0–46.0)
Hemoglobin: 13.5 g/dL (ref 12.0–15.0)
Lymphocytes Relative: 36.5 % (ref 12.0–46.0)
Lymphs Abs: 2.3 10*3/uL (ref 0.7–4.0)
MCHC: 33.5 g/dL (ref 30.0–36.0)
MCV: 89.6 fl (ref 78.0–100.0)
Monocytes Absolute: 0.4 10*3/uL (ref 0.1–1.0)
Monocytes Relative: 6.5 % (ref 3.0–12.0)
Neutro Abs: 3 10*3/uL (ref 1.4–7.7)
Neutrophils Relative %: 48.6 % (ref 43.0–77.0)
Platelets: 285 10*3/uL (ref 150.0–400.0)
RBC: 4.48 Mil/uL (ref 3.87–5.11)
RDW: 13 % (ref 11.5–15.5)
WBC: 6.2 10*3/uL (ref 4.0–10.5)

## 2022-09-23 LAB — COMPREHENSIVE METABOLIC PANEL
ALT: 23 U/L (ref 0–35)
AST: 20 U/L (ref 0–37)
Albumin: 4.2 g/dL (ref 3.5–5.2)
Alkaline Phosphatase: 63 U/L (ref 39–117)
BUN: 14 mg/dL (ref 6–23)
CO2: 29 mEq/L (ref 19–32)
Calcium: 9.3 mg/dL (ref 8.4–10.5)
Chloride: 102 mEq/L (ref 96–112)
Creatinine, Ser: 0.67 mg/dL (ref 0.40–1.20)
GFR: 94.38 mL/min (ref >60.00)
Glucose, Bld: 111 mg/dL — ABNORMAL HIGH (ref 70–99)
Potassium: 3.8 mEq/L (ref 3.5–5.1)
Sodium: 139 mEq/L (ref 135–145)
Total Bilirubin: 0.6 mg/dL (ref 0.2–1.2)
Total Protein: 7 g/dL (ref 6.0–8.3)

## 2022-09-23 LAB — HEMOGLOBIN A1C: Hgb A1c MFr Bld: 5.6 % (ref 4.6–6.5)

## 2022-09-23 LAB — TSH: TSH: 2.47 u[IU]/mL (ref 0.35–5.50)

## 2022-09-23 MED ORDER — AMLODIPINE BESYLATE 5 MG PO TABS: 5.0000 mg | ORAL_TABLET | Freq: Every day | ORAL | 2 refills | Status: DC

## 2022-09-23 MED ORDER — POTASSIUM CHLORIDE CRYS ER 20 MEQ PO TBCR: 20.0000 meq | EXTENDED_RELEASE_TABLET | Freq: Two times a day (BID) | ORAL | 1 refills | Status: DC

## 2022-09-23 MED ORDER — ALPRAZOLAM 0.5 MG PO TABS: 0.5000 mg | ORAL_TABLET | Freq: Every day | ORAL | 0 refills | Status: DC

## 2022-09-23 NOTE — Assessment & Plan Note (Signed)
Chronic Controlled, Stable Continue Effexor 75 mg daily 

## 2022-09-23 NOTE — Assessment & Plan Note (Signed)
Chronic Controlled, Stable Continue Effexor 75 mg daily, alprazolam 0.5 mg daily as needed

## 2022-09-23 NOTE — Assessment & Plan Note (Addendum)
Chronic She is actively working on weight loss She is exercising regularly-she knows she can do more She would like to get back to doing weights because she feels she has lost some muscle mass Healthy diet, decrease portions

## 2022-09-23 NOTE — Assessment & Plan Note (Signed)
chronic Infrequent migraines Continue Imitrex 50 mg daily as needed, Zofran 4 mg as needed

## 2022-09-23 NOTE — Assessment & Plan Note (Addendum)
Chronic BP controlled Continue losartan-hydrochlorothiazide 100-25 mg daily, amlodipine 5 mg daily Cmp, cbc  EKG: Normal sinus rhythm at 66 bpm, LAD, RSR in V1, V2.  No change from previous EKG from 2021

## 2022-09-23 NOTE — Assessment & Plan Note (Signed)
Chronic Check a1c Low sugar / carb diet Stressed regular exercise  

## 2022-09-23 NOTE — Assessment & Plan Note (Signed)
Chronic Regular exercise and healthy diet encouraged Check lipid panel  Continue lifestyle control 

## 2022-10-02 ENCOUNTER — Encounter: Payer: Self-pay | Admitting: Internal Medicine

## 2022-10-06 ENCOUNTER — Ambulatory Visit (AMBULATORY_SURGERY_CENTER): Payer: Commercial Managed Care - PPO | Admitting: *Deleted

## 2022-10-06 ENCOUNTER — Encounter: Payer: Self-pay | Admitting: Gastroenterology

## 2022-10-06 VITALS — Ht 66.0 in | Wt 197.0 lb

## 2022-10-06 DIAGNOSIS — Z86010 Personal history of colonic polyps: Secondary | ICD-10-CM

## 2022-10-06 MED ORDER — NA SULFATE-K SULFATE-MG SULF 17.5-3.13-1.6 GM/177ML PO SOLN: 1.0000 | Freq: Once | ORAL | 0 refills | Status: AC

## 2022-10-06 NOTE — Progress Notes (Signed)
Pt's name and DOB verified at the beginning of the pre-visit.  Pt denies any difficulty with ambulating,sitting, laying down or rolling side to side Gave both [facility] main # and MD on call # prior to instructions.  No egg or soy allergy known to patient  No issues known to pt with past sedation with any surgeries or procedures Pt denies having issues being intubated Pt has no issues moving head neck or swallowing No FH of Malignant Hyperthermia Pt is not on diet pills Pt is not on home 02  Pt is not on blood thinners  Pt denies issues with constipation  Pt is not on dialysis Pt denise any abnormal heart rhythms  Pt denies any upcoming cardiac testing Pt encouraged to use to use Singlecare or Goodrx to reduce cost  Patient's chart reviewed by Andrea Oneal CNRA prior to pre-visit and patient appropriate for the [facility].  Pre-visit completed and red dot placed by patient's name on their procedure day (on provider's schedule).  . Visit by phone Pt states weight is 197 lb Instructed pt why it is important to and  to call if they have any changes in health or new medications. Directed them to the # given and on instructions.   Pt states they will.  Instructions reviewed with pt and pt states understanding. Instructed to review again prior to procedure. Pt states they will.  Instructions sent by mail with coupon and by my chart Instructed not to smoke Marijuana for the day before and day of procedure

## 2022-10-13 ENCOUNTER — Ambulatory Visit
Admission: RE | Admit: 2022-10-13 | Discharge: 2022-10-13 | Disposition: A | Discharge status: Home / Self Care | Payer: Commercial Managed Care - PPO | Source: Ambulatory Visit

## 2022-10-13 ENCOUNTER — Encounter (INDEPENDENT_AMBULATORY_CARE_PROVIDER_SITE_OTHER): Payer: Commercial Managed Care - PPO | Admitting: Internal Medicine

## 2022-10-13 DIAGNOSIS — G479 Sleep disorder, unspecified: Secondary | ICD-10-CM

## 2022-10-13 DIAGNOSIS — Z1231 Encounter for screening mammogram for malignant neoplasm of breast: Secondary | ICD-10-CM

## 2022-10-14 DIAGNOSIS — G479 Sleep disorder, unspecified: Secondary | ICD-10-CM

## 2022-10-14 MED ORDER — TRAZODONE HCL 50 MG PO TABS: 50.0000 mg | ORAL_TABLET | Freq: Every day | ORAL | 5 refills | Status: DC

## 2022-10-14 NOTE — Telephone Encounter (Signed)

## 2022-10-17 ENCOUNTER — Encounter: Payer: Commercial Managed Care - PPO | Admitting: Gastroenterology

## 2022-11-12 ENCOUNTER — Other Ambulatory Visit: Payer: Self-pay | Admitting: Internal Medicine

## 2022-11-24 ENCOUNTER — Encounter: Payer: Self-pay | Admitting: Internal Medicine

## 2022-11-24 DIAGNOSIS — H938X9 Other specified disorders of ear, unspecified ear: Secondary | ICD-10-CM

## 2022-11-24 DIAGNOSIS — J3489 Other specified disorders of nose and nasal sinuses: Secondary | ICD-10-CM

## 2023-01-06 ENCOUNTER — Other Ambulatory Visit: Payer: Self-pay | Admitting: Internal Medicine

## 2023-01-07 MED ORDER — ALPRAZOLAM 0.5 MG PO TABS: 0.5000 mg | ORAL_TABLET | Freq: Every evening | ORAL | 0 refills | Status: DC | PRN

## 2023-01-15 ENCOUNTER — Encounter (INDEPENDENT_AMBULATORY_CARE_PROVIDER_SITE_OTHER): Payer: Self-pay | Admitting: Otolaryngology

## 2023-01-15 ENCOUNTER — Ambulatory Visit (INDEPENDENT_AMBULATORY_CARE_PROVIDER_SITE_OTHER): Payer: Commercial Managed Care - PPO | Admitting: Otolaryngology

## 2023-01-15 VITALS — BP 158/94 | HR 69

## 2023-01-15 DIAGNOSIS — R0981 Nasal congestion: Secondary | ICD-10-CM | POA: Diagnosis not present

## 2023-01-15 DIAGNOSIS — M26623 Arthralgia of bilateral temporomandibular joint: Secondary | ICD-10-CM

## 2023-01-15 DIAGNOSIS — J3089 Other allergic rhinitis: Secondary | ICD-10-CM

## 2023-01-15 DIAGNOSIS — J329 Chronic sinusitis, unspecified: Secondary | ICD-10-CM

## 2023-01-15 DIAGNOSIS — J342 Deviated nasal septum: Secondary | ICD-10-CM | POA: Diagnosis not present

## 2023-01-15 DIAGNOSIS — R519 Headache, unspecified: Secondary | ICD-10-CM

## 2023-01-15 MED ORDER — METHYLPREDNISOLONE 4 MG PO TBPK: ORAL_TABLET | ORAL | 1 refills | Status: DC

## 2023-01-15 MED ORDER — CETIRIZINE HCL 10 MG PO TABS: 10.0000 mg | ORAL_TABLET | Freq: Every day | ORAL | 11 refills | Status: DC

## 2023-01-15 MED ORDER — FLUTICASONE PROPIONATE 50 MCG/ACT NA SUSP: 2.0000 | Freq: Every day | NASAL | 6 refills | Status: DC

## 2023-01-15 NOTE — Progress Notes (Signed)
ENT CONSULT:  Reason for Consult: clogged up right ear, facial pain/pressure hx of environmental allergies   HPI: Andrea Oneal is an 62 y.o. female with hx of migraines, seasonal allergies  here for clogged up ear on the right and pressure over her right face x 6 months. She has seasonal allergies, which act up in the Spring and she starts Claritin. She denies hx of sinus surgeries, allergy testing or allergy shots. She is on daily Claritin. She has nasal congestion. No recent imaging of the sinuses. She reports TMJ issues for several years.   Records Reviewed:  ED note from 08/01/2022 62 year old female presents with left eye pain. Patient indicates that she woke up this morning having some allergies with some watery itchy eyes, and she relates she used her normal allergy eyedrops. She indicates shortly after she started having left pain, redness, irritation, photophobia. Patient indicates that the discomfort has continued over the past several hours. Patient indicates that she does not recall injuring the left eye, getting any objects in the left eye or trauma to the left. She indicates that she just started having nausea with the pain over the past hour. She is without fever, chills, cough, congestion. Tolerating fluids well.   Office note by Cheryll Cockayne 03/17/2022 Andrea Oneal is here for follow up of her chronic medical problems, including htn, hld, migraines, hyperglycemia, anxiety, depression   Does not check BP at home - can tell when its high - maybe twice a month.    It feels like there is something in her right nostril and can not get it out.  Not painful.        Past Medical History:  Diagnosis Date   Allergy    Allergy to alpha-gal    Anxiety    Arthritis    Depression    Hypertension    Migraine     Past Surgical History:  Procedure Laterality Date   APPENDECTOMY     KIDNEY SURGERY     age 67 extra tuve per pt removed   TONSILLECTOMY  1986    Family History  Problem  Relation Age of Onset   Arthritis Mother    Diabetes Mother    Depression Mother    Arthritis Father    Heart disease Father    Hypertension Father    Depression Brother    Arthritis Maternal Grandmother    Diabetes Maternal Grandfather    Arthritis Paternal Grandmother    Colon cancer Neg Hx    Colon polyps Neg Hx    Esophageal cancer Neg Hx    Stomach cancer Neg Hx    Rectal cancer Neg Hx     Social History:  reports that she quit smoking about 30 years ago. Her smoking use included cigarettes. She started smoking about 46 years ago. She has a 4 pack-year smoking history. She has never used smokeless tobacco. She reports current alcohol use of about 7.0 standard drinks of alcohol per week. She reports current drug use. Drug: Marijuana.  Allergies:  Allergies  Allergen Reactions   Beef (Bovine) Protein Hives and Itching   Bactrim [Sulfamethoxazole-Trimethoprim] Other (See Comments)    Hives, rash, nausea    Medications: I have reviewed the patient's current medications.  The PMH, PSH, Medications, Allergies, and SH were reviewed and updated.  ROS: Constitutional: Negative for fever, weight loss and weight gain. Cardiovascular: Negative for chest pain and dyspnea on exertion. Respiratory: Is not experiencing shortness of breath at rest. Gastrointestinal: Negative  for nausea and vomiting. Neurological: Negative for headaches. Psychiatric: The patient is not nervous/anxious  Blood pressure (!) 158/94, pulse 69, SpO2 95%.  PHYSICAL EXAM:  Exam: General: Well-developed, well-nourished Respiratory Respiratory effort: Equal inspiration and expiration without stridor Cardiovascular Peripheral Vascular: Warm extremities with equal color/perfusion Eyes: No nystagmus with equal extraocular motion bilaterally Neuro/Psych/Balance: Patient oriented to person, place, and time; Appropriate mood and affect; Gait is intact with no imbalance; Cranial nerves I-XII are intact Head and  Face Inspection: Normocephalic and atraumatic without mass or lesion Palpation: Facial skeleton intact without bony stepoffs Salivary Glands: No mass or tenderness Facial Strength: Facial motility symmetric and full bilaterally ENT Pinna: External ear intact and fully developed External canal: Canal is patent with intact skin Tympanic Membrane: Clear and mobile External Nose: No scar or anatomic deformity Internal Nose: Septum is deviated to the left. No polyp, or purulence. Mucosal edema and erythema present.  Bilateral inferior turbinate hypertrophy.  Lips, Teeth, and gums: Mucosa and teeth intact and viable TMJ: No pain to palpation with full mobility Oral cavity/oropharynx: No erythema or exudate, no lesions present Nasopharynx: No mass or lesion with intact mucosa Hypopharynx: Intact mucosa without pooling of secretions Neck Neck and Trachea: Midline trachea without mass or lesion Thyroid: No mass or nodularity Lymphatics: No lymphadenopathy  Procedure:   PROCEDURE NOTE: nasal endoscopy  Preoperative diagnosis: chronic sinusitis symptoms  Postoperative diagnosis: same  Procedure: Diagnostic nasal endoscopy (65784)  Surgeon: Ashok Croon, M.D.  Anesthesia: Topical lidocaine and Afrin  H&P REVIEW: The patient's history and physical were reviewed today prior to procedure. All medications were reviewed and updated as well. Complications: None Condition is stable throughout exam Indications and consent: The patient presents with symptoms of chronic sinusitis not responding to previous therapies. All the risks, benefits, and potential complications were reviewed with the patient preoperatively and informed consent was obtained. The time out was completed with confirmation of the correct procedure.   Procedure: The patient was seated upright in the clinic. Topical lidocaine and Afrin were applied to the nasal cavity. After adequate anesthesia had occurred, the rigid nasal  endoscope was passed into the nasal cavity. The nasal mucosa, turbinates, septum, and sinus drainage pathways were visualized bilaterally. This revealed no purulence or significant secretions that might be cultured. There were no polyps or sites of significant inflammation. The mucosa was intact and there was no crusting present. The scope was then slowly withdrawn and the patient tolerated the procedure well. There were no complications or blood loss.   Studies Reviewed:none  Assessment/Plan: Encounter Diagnoses  Name Primary?   Chronic sinusitis, unspecified location Yes   Bilateral temporomandibular joint pain    Nasal congestion    Environmental and seasonal allergies    Nasal septal deviation    Facial pain    62 yoF here for right sided facial pain/pressure and right ear sensation of discomfort sensation of clogged ears. Hx of environmental and seasonal allergies on daily Claritin. On Flonase in the Spring when sx worse. She reports nasal congestion and PND. No sinus surgery, no allergy testing or allergy shots.  Exam including nasal endoscopy with normal ear exam AU, nasal mucosal edema and L sided NSD, no purulence or polyps and no masses or lesions in the nasopharynx. There was tenderness of b/l TMJ and subluxation but no trismus  Ddx for facial pain is chronic rhino sinusitis vs TMJ  CT sinuses to evaluate for chronic sinus inflammation I discussed management of TMJ symptoms   Nasal  congestion post-nasal drainage and hx of allergies  - will change her current antihistamine to Zyrtec and start daily Flonase - Medrol pack to help with sx resolution    Ear pressure - suspect eustachian tube dysfunction  - normal ear exam  -I discussed the relationship of nasal congestion history of allergies and eustachian tube dysfunction -Will initiate medical management as above for symptom relief  She will return after imaging   Thank you for allowing me to participate in the care of  this patient. Please do not hesitate to contact me with any questions or concerns.   Ashok Croon, MD Otolaryngology [facility] Phone: [phone] Fax: [phone]    01/15/2023, 11:44 AM

## 2023-01-15 NOTE — Patient Instructions (Addendum)
Schedule CT sinuses  Start Zyrtec and stop Claritin Start Flonase  Take Medrol pack Return after scan  See information about Eustachian Tube Dysfunction below:    Overview The eustachian (say "you-STAY-shee-un") tubes connect the middle ear on each side to the back of the throat. They keep air pressure stable in the ears. If your eustachian tubes become blocked, the air pressure in your ears changes. A quick change in air pressure can cause eustachian tubes to close up. This might happen when an airplane changes altitude or when a scuba diver goes up or down underwater. And a cold can make the tubes swell and block the fluid in the middle ear from draining out. That can cause pain.  Eustachian tube problems often clear up on their own or after treating the cause of the blockage. If your tubes continue to be blocked, you may need surgery.  Follow-up care is a key part of your treatment and safety. Be sure to make and go to all appointments, and call your doctor or nurse advice line (811 in most provinces and territories) if you are having problems. It's also a good idea to know your test results and keep a list of the medicines you take.  How can you care for yourself at home? Try a simple exercise to help open blocked tubes. Close your mouth, hold your nose, and gently blow as if you are blowing your nose. Yawning and chewing gum also may help. You may hear or feel a "pop" when the tubes open. To ease ear pain, apply a warm face cloth or a heating pad set on low. There may be some drainage from the ear when the heat melts earwax. Put a cloth between the heat source and your skin. If your doctor prescribed antibiotics, take them as directed. Do not stop taking them just because you feel better. You need to take the full course of antibiotics. Be safe with medicines. Depending on the cause of the problem, your doctor may recommend over-the-counter medicine. For example, adults may try  decongestants for cold symptoms or nasal spray steroids for allergies. Follow the instructions carefully.   TMJ (Temporomandibular Joint Syndrome) The temporomandibular (tem-puh-roe-man-DIB-u-lur) joint (TMJ) acts like a sliding hinge, connecting your jawbone to your skull. You have one joint on each side of your jaw. TMJ disorders -- a type of temporomandibular disorder or TMD -- can cause pain in your jaw joint and in the muscles that control jaw movement.  The exact cause of a person's TMJ disorder is often difficult to determine. Your pain may be due to a combination of factors, such as genetics, arthritis or jaw injury. Some people who have jaw pain also tend to clench or grind their teeth (bruxism), although many people habitually clench or grind their teeth and never develop TMJ disorders.  In most cases, the pain and discomfort associated with TMJ disorders is temporary and can be relieved with self-managed care or nonsurgical treatments. This includes stress reduction, softer diet when the pain is present, anti-inflammatory pain medications such as Motrin and warm compresses.

## 2023-01-21 ENCOUNTER — Encounter (INDEPENDENT_AMBULATORY_CARE_PROVIDER_SITE_OTHER): Payer: Self-pay | Admitting: Otolaryngology

## 2023-01-29 ENCOUNTER — Telehealth: Payer: Self-pay

## 2023-01-29 NOTE — Telephone Encounter (Signed)
Faxed CT request to [facility] [phone]) per patient request with confirmed receipt.

## 2023-01-30 ENCOUNTER — Ambulatory Visit (HOSPITAL_COMMUNITY): Payer: Commercial Managed Care - PPO

## 2023-01-30 ENCOUNTER — Telehealth (INDEPENDENT_AMBULATORY_CARE_PROVIDER_SITE_OTHER): Payer: Self-pay

## 2023-01-30 ENCOUNTER — Other Ambulatory Visit: Payer: Self-pay | Admitting: Internal Medicine

## 2023-01-30 MED ORDER — BUPROPION HCL ER (XL) 150 MG PO TB24: 150.0000 mg | ORAL_TABLET | Freq: Every day | ORAL | 0 refills | Status: DC

## 2023-02-02 NOTE — Telephone Encounter (Signed)
Andrea Oneal can you look into this and see what needs to be done

## 2023-02-05 ENCOUNTER — Other Ambulatory Visit: Payer: Self-pay | Admitting: Internal Medicine

## 2023-02-24 ENCOUNTER — Encounter: Payer: Self-pay | Admitting: Otolaryngology

## 2023-03-13 ENCOUNTER — Encounter (INDEPENDENT_AMBULATORY_CARE_PROVIDER_SITE_OTHER): Payer: Self-pay | Admitting: Otolaryngology

## 2023-03-13 ENCOUNTER — Ambulatory Visit (INDEPENDENT_AMBULATORY_CARE_PROVIDER_SITE_OTHER): Payer: Commercial Managed Care - PPO | Admitting: Otolaryngology

## 2023-03-13 VITALS — BP 152/82 | HR 96

## 2023-03-13 DIAGNOSIS — J343 Hypertrophy of nasal turbinates: Secondary | ICD-10-CM

## 2023-03-13 DIAGNOSIS — J342 Deviated nasal septum: Secondary | ICD-10-CM

## 2023-03-13 DIAGNOSIS — I672 Cerebral atherosclerosis: Secondary | ICD-10-CM

## 2023-03-13 DIAGNOSIS — R519 Headache, unspecified: Secondary | ICD-10-CM

## 2023-03-13 DIAGNOSIS — J3089 Other allergic rhinitis: Secondary | ICD-10-CM

## 2023-03-13 DIAGNOSIS — M26623 Arthralgia of bilateral temporomandibular joint: Secondary | ICD-10-CM

## 2023-03-13 DIAGNOSIS — R0981 Nasal congestion: Secondary | ICD-10-CM

## 2023-03-13 NOTE — Progress Notes (Signed)
ENT Progress Note:  Update 03/13/23:  Discussed the use of AI scribe software for clinical note transcription with the patient, who gave verbal consent to proceed.  History of Present Illness   The patient, with a history of chronic nasal congestion, reports significant improvement since the last visit. They deny any current nasal or sinus discomfort. They have been compliant with the prescribed Zyrtec, and Flonase regimen. They had steroids (medrol dose pack) and it helped with her nasal congestion. They report minimal ear discomfort, but no pain or hearing changes.  A recent CT sinuses scan done at [facility] in October 2024 showed mild thickening of the mucosa in a few sinuses and a septal deviation, but no evidence of chronic sinusitis.   The patient also reported a concern about atherosclerosis mentioned in a previous scan report. They have a history of normal cholesterol levels but deny any history of stroke. I suggested discussion with PCP.       Initial Evaluation 01/15/23 Reason for Consult: clogged up right ear, facial pain/pressure hx of environmental allergies   HPI: Andrea Oneal is an 62 y.o. female with hx of migraines, seasonal allergies  here for clogged up ear on the right and pressure over her right face x 6 months. She has seasonal allergies, which act up in the Spring and she starts Claritin. She denies hx of sinus surgeries, allergy testing or allergy shots. She is on daily Claritin. She has nasal congestion. No recent imaging of the sinuses. She reports TMJ issues for several years.   Records Reviewed:  ED note from 08/01/2022 62 year old female presents with left eye pain. Patient indicates that she woke up this morning having some allergies with some watery itchy eyes, and she relates she used her normal allergy eyedrops. She indicates shortly after she started having left pain, redness, irritation, photophobia. Patient indicates that the discomfort has continued over the  past several hours. Patient indicates that she does not recall injuring the left eye, getting any objects in the left eye or trauma to the left. She indicates that she just started having nausea with the pain over the past hour. She is without fever, chills, cough, congestion. Tolerating fluids well.   Office note by Cheryll Cockayne 03/17/2022 Andrea Oneal is here for follow up of her chronic medical problems, including htn, hld, migraines, hyperglycemia, anxiety, depression   Does not check BP at home - can tell when its high - maybe twice a month.    It feels like there is something in her right nostril and can not get it out.  Not painful.        Past Medical History:  Diagnosis Date   Allergy    Allergy to alpha-gal    Anxiety    Arthritis    Depression    Hypertension    Migraine     Past Surgical History:  Procedure Laterality Date   APPENDECTOMY     KIDNEY SURGERY     age 29 extra tuve per pt removed   TONSILLECTOMY  1986    Family History  Problem Relation Age of Onset   Arthritis Mother    Diabetes Mother    Depression Mother    Arthritis Father    Heart disease Father    Hypertension Father    Depression Brother    Arthritis Maternal Grandmother    Diabetes Maternal Grandfather    Arthritis Paternal Grandmother    Colon cancer Neg Hx    Colon polyps Neg  Hx    Esophageal cancer Neg Hx    Stomach cancer Neg Hx    Rectal cancer Neg Hx     Social History:  reports that she quit smoking about 30 years ago. Her smoking use included cigarettes. She started smoking about 46 years ago. She has a 4 pack-year smoking history. She has never used smokeless tobacco. She reports current alcohol use of about 7.0 standard drinks of alcohol per week. She reports current drug use. Drug: Marijuana.  Allergies:  Allergies  Allergen Reactions   Beef (Bovine) Protein Hives and Itching   Bactrim [Sulfamethoxazole-Trimethoprim] Other (See Comments)    Hives, rash, nausea     Medications: I have reviewed the patient's current medications.  The PMH, PSH, Medications, Allergies, and SH were reviewed and updated.  ROS: Constitutional: Negative for fever, weight loss and weight gain. Cardiovascular: Negative for chest pain and dyspnea on exertion. Respiratory: Is not experiencing shortness of breath at rest. Gastrointestinal: Negative for nausea and vomiting. Neurological: Negative for headaches. Psychiatric: The patient is not nervous/anxious  Blood pressure (!) 152/82, pulse 96, SpO2 94%.  PHYSICAL EXAM:  Exam: General: Well-developed, well-nourished Respiratory Respiratory effort: Equal inspiration and expiration without stridor Cardiovascular Peripheral Vascular: Warm extremities with equal color/perfusion Eyes: No nystagmus with equal extraocular motion bilaterally Neuro/Psych/Balance: Patient oriented to person, place, and time; Appropriate mood and affect; Gait is intact with no imbalance; Cranial nerves I-XII are intact Head and Face Inspection: Normocephalic and atraumatic without mass or lesion Facial Strength: Facial motility symmetric and full bilaterally ENT Pinna: External ear intact and fully developed External canal: Canal is patent with intact skin Tympanic Membrane: Clear and mobile External Nose: No scar or anatomic deformity Internal Nose: Septum is deviated to the left on anterior rhinoscopy Bilateral inferior turbinate hypertrophy.  Lips, Teeth, and gums: Mucosa and teeth intact and viable TMJ: No pain to palpation with full mobility Oral cavity/oropharynx: No erythema or exudate, no lesions present Neck Neck and Trachea: Midline trachea without mass or lesion Thyroid: No mass or nodularity Lymphatics: No lymphadenopathy  Procedure: none  Studies Reviewed: CT sinuses done at [facility] 02/06/23    Assessment/Plan: Encounter Diagnoses  Name Primary?   Facial pain Yes   Nasal septal deviation    Environmental and  seasonal allergies    Chronic nasal congestion    Bilateral temporomandibular joint pain    Hypertrophy of both inferior nasal turbinates     62 yoF here for right sided facial pain/pressure and right ear sensation of discomfort sensation of clogged ears. Hx of environmental and seasonal allergies on daily Claritin. On Flonase in the Spring when sx worse. She reports nasal congestion and PND. No sinus surgery, no allergy testing or allergy shots.  Exam including nasal endoscopy with normal ear exam AU, nasal mucosal edema and L sided NSD, no purulence or polyps and no masses or lesions in the nasopharynx. There was tenderness of b/l TMJ and subluxation but no trismus  Ddx for facial pain is chronic rhino sinusitis vs TMJ  CT sinuses to evaluate for chronic sinus inflammation I discussed management of TMJ symptoms   Nasal congestion post-nasal drainage and hx of allergies  - will change her current antihistamine to Zyrtec and start daily Flonase - Medrol pack to help with sx resolution    Ear pressure - suspect eustachian tube dysfunction  - normal ear exam  -I discussed the relationship of nasal congestion history of allergies and eustachian tube dysfunction -Will initiate medical management  as above for symptom relief  She will return after imaging  Update 03/13/23 Assessment and Plan    Chronic Nasal Congestion  Significant improvement in nasal and sinus symptoms after Medrol Dose pack and Zyrtec/Flonase. CT sinuses (do not have access to imaging as it was done at [facility]) with report mentioning mild mucosal thickening without lesions or masses. Symptoms consistent with mild sinus inflammation, likely secondary to allergies. No current ear pain or hearing changes. Continued use of Zyrtec and Flonase has been effective. Discussed that 80% of patients with similar symptoms have clear sinuses on initial assessment, indicating that allergies and nasal congestion as a common etiology. -  Continue Zyrtec 10 mg daily and Flonase 2 puffs b/l nares BID - Consider saline spray to avoid overly dry nasal mucosa - Repeat sinus scan if symptoms recur - Follow up in 6-12 months  Atherosclerosis (intracranial) on CT sinus report Discussed atherosclerosis noted on CT sinuses, involving the narrowing of blood vessels due to cholesterol plaques. No history of stroke or concerning symptoms. Advised to discuss findings with primary care physician, Dr. Lawerance Bach, especially in the context of cholesterol levels. Majority of people in their age group have some degree of atherosclerosis. - Need to discuss atherosclerosis findings with primary care physician - Consider obtaining a CD of the CT scan for review  General Health Maintenance Up to date on medications with no immediate needs for refills. Advised to maintain current prescriptions and follow up with primary care physician for routine health maintenance. - Continue current medications - Follow up with primary care physician for routine health maintenance  Follow-up - Follow up in 6 months.        Ashok Croon, MD Otolaryngology [facility] Phone: [phone] Fax: [phone]    03/13/2023, 3:17 PM

## 2023-03-22 ENCOUNTER — Other Ambulatory Visit: Payer: Self-pay | Admitting: Internal Medicine

## 2023-03-29 ENCOUNTER — Encounter: Payer: Self-pay | Admitting: Internal Medicine

## 2023-03-29 NOTE — Patient Instructions (Addendum)
      Blood work was ordered.       Medications changes include :   None    A referral was ordered and someone will call you to schedule an appointment.     Return in about 6 months (around 09/29/2023) for Physical Exam.

## 2023-03-29 NOTE — Progress Notes (Unsigned)
Subjective:    Patient ID: Andrea Oneal, female    DOB: 18-Jun-1960, 62 y.o.   MRN: [id number]     HPI Andrea Oneal is here for follow up of her chronic medical problems.  Overall doing well.  Needs to work exercise.   Does exercise but not regularly.   Medications and allergies reviewed with patient and updated if appropriate.  Current Outpatient Medications on File Prior to Visit  Medication Sig Dispense Refill   ALPRAZolam (XANAX) 0.5 MG tablet Take 1 tablet (0.5 mg total) by mouth at bedtime as needed for anxiety. 15 tablet 0   amLODipine (NORVASC) 5 MG tablet Take 1 tablet (5 mg total) by mouth daily. 90 tablet 2   buPROPion (WELLBUTRIN XL) 150 MG 24 hr tablet Take 1 tablet (150 mg total) by mouth daily. 90 tablet 0   cetirizine (ZYRTEC) 10 MG tablet Take 1 tablet (10 mg total) by mouth daily. 30 tablet 11   fluticasone (FLONASE) 50 MCG/ACT nasal spray Place 2 sprays into both nostrils daily. 16 g 6   ketorolac (ACULAR) 0.5 % ophthalmic solution Place 1 drop into both eyes every 6 (six) hours. 5 mL 0   losartan-hydrochlorothiazide (HYZAAR) 100-25 MG tablet TAKE 1 TABLET BY MOUTH EVERY DAY 90 tablet 1   meloxicam (MOBIC) 7.5 MG tablet Take 1 Tablet By Mouth Every 12 Hours prn 60 tablet 5   Multiple Vitamin (MULTIVITAMIN WITH MINERALS) TABS tablet Take 1 tablet by mouth daily.     ondansetron (ZOFRAN) 4 MG tablet Take 1 tablet (4 mg total) by mouth every 8 (eight) hours as needed for nausea or vomiting. 20 tablet 0   potassium chloride SA (KLOR-CON M20) 20 MEQ tablet Take 1 tablet (20 mEq total) by mouth 2 (two) times daily. 180 tablet 1   SUMAtriptan (IMITREX) 50 MG tablet Take 1 tablet (50 mg total) by mouth once as needed for up to 1 dose for migraine. May repeat in 2 hours if headache persists or recurs. 10 tablet 5   triamcinolone (KENALOG) 0.1 % Apply 1 application topically 2 (two) times daily as needed (eczema). 454 g 1   venlafaxine XR (EFFEXOR-XR) 75 MG 24 hr capsule  TAKE 1 CAPSULE BY MOUTH DAILY WITH BREAKFAST. 90 capsule 1   No current facility-administered medications on file prior to visit.     Review of Systems  Constitutional:  Negative for fever.  Respiratory:  Negative for cough, shortness of breath and wheezing.   Cardiovascular:  Negative for chest pain, palpitations and leg swelling.  Neurological:  Negative for dizziness, light-headedness and headaches.       Objective:   Vitals:   03/31/23 0829  BP: 128/84  Pulse: 90  Temp: 98.2 F (36.8 C)  SpO2: 99%   BP Readings from Last 3 Encounters:  03/31/23 128/84  03/13/23 (!) 152/82  01/15/23 (!) 158/94   Wt Readings from Last 3 Encounters:  03/31/23 198 lb (89.8 kg)  10/06/22 197 lb (89.4 kg)  09/23/22 197 lb 8 oz (89.6 kg)   Body mass index is 31.96 kg/m.    Physical Exam Constitutional:      General: She is not in acute distress.    Appearance: Normal appearance.  HENT:     Head: Normocephalic and atraumatic.  Eyes:     Conjunctiva/sclera: Conjunctivae normal.  Cardiovascular:     Rate and Rhythm: Normal rate and regular rhythm.     Heart sounds: Normal heart sounds.  Pulmonary:  Effort: Pulmonary effort is normal. No respiratory distress.     Breath sounds: Normal breath sounds. No wheezing.  Musculoskeletal:     Cervical back: Neck supple.     Right lower leg: No edema.     Left lower leg: No edema.  Lymphadenopathy:     Cervical: No cervical adenopathy.  Skin:    General: Skin is warm and dry.     Findings: No rash.  Neurological:     Mental Status: She is alert. Mental status is at baseline.  Psychiatric:        Mood and Affect: Mood normal.        Behavior: Behavior normal.        Lab Results  Component Value Date   WBC 6.2 09/23/2022   HGB 13.5 09/23/2022   HCT 40.2 09/23/2022   PLT 285.0 09/23/2022   GLUCOSE 111 (H) 09/23/2022   CHOL 175 09/23/2022   TRIG 142.0 09/23/2022   HDL 44.80 09/23/2022   LDLCALC 102 (H) 09/23/2022   ALT  23 09/23/2022   AST 20 09/23/2022   NA 139 09/23/2022   K 3.8 09/23/2022   CL 102 09/23/2022   CREATININE 0.67 09/23/2022   BUN 14 09/23/2022   CO2 29 09/23/2022   TSH 2.47 09/23/2022   HGBA1C 5.6 09/23/2022   The 10-year ASCVD risk score (Arnett DK, et al., 2019) is: 5.1%   Values used to calculate the score:     Age: 70 years     Sex: Female     Is Non-Hispanic African American: No     Diabetic: No     Tobacco smoker: No     Systolic Blood Pressure: 128 mmHg     Is BP treated: Yes     HDL Cholesterol: 44.8 mg/dL     Total Cholesterol: 175 mg/dL   Assessment & Plan:    See Problem List for Assessment and Plan of chronic medical problems.

## 2023-03-30 IMAGING — MG MM DIGITAL SCREENING BILAT W/ TOMO AND CAD
8 series · 8 of 24 positions shown · non-contrast
Comparison: Previous exam(s).

CLINICAL DATA: Screening.

EXAM:
DIGITAL SCREENING BILATERAL MAMMOGRAM WITH TOMOSYNTHESIS AND CAD
TECHNIQUE: Bilateral screening digital craniocaudal and mediolateral oblique
mammograms were obtained. Bilateral screening digital breast
tomosynthesis was performed. The images were evaluated with
computer-aided detection.

[R MLO synth-2D]
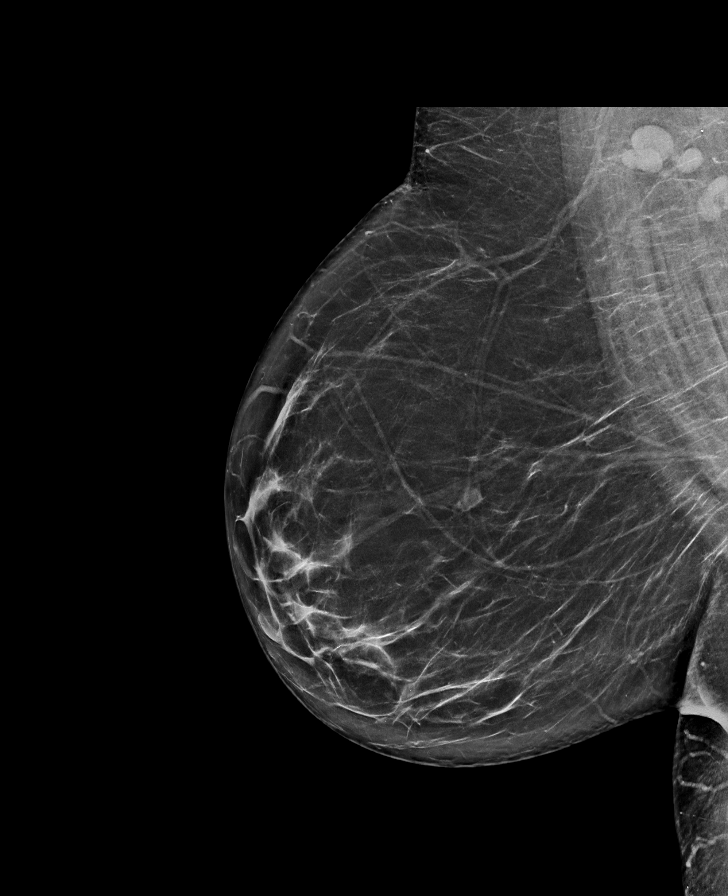

[L MLO synth-2D]
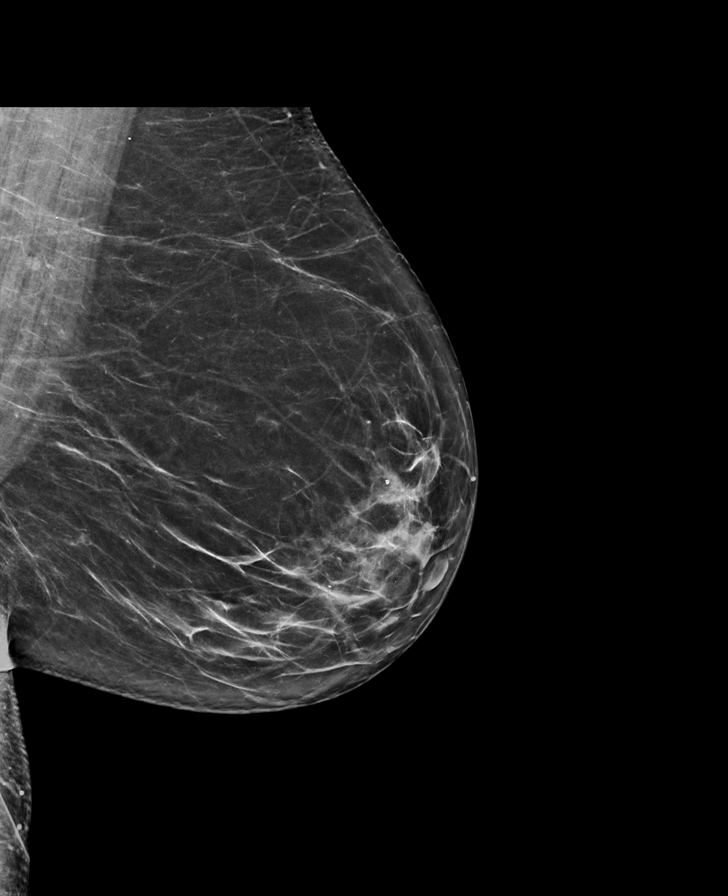

[R CC synth-2D]
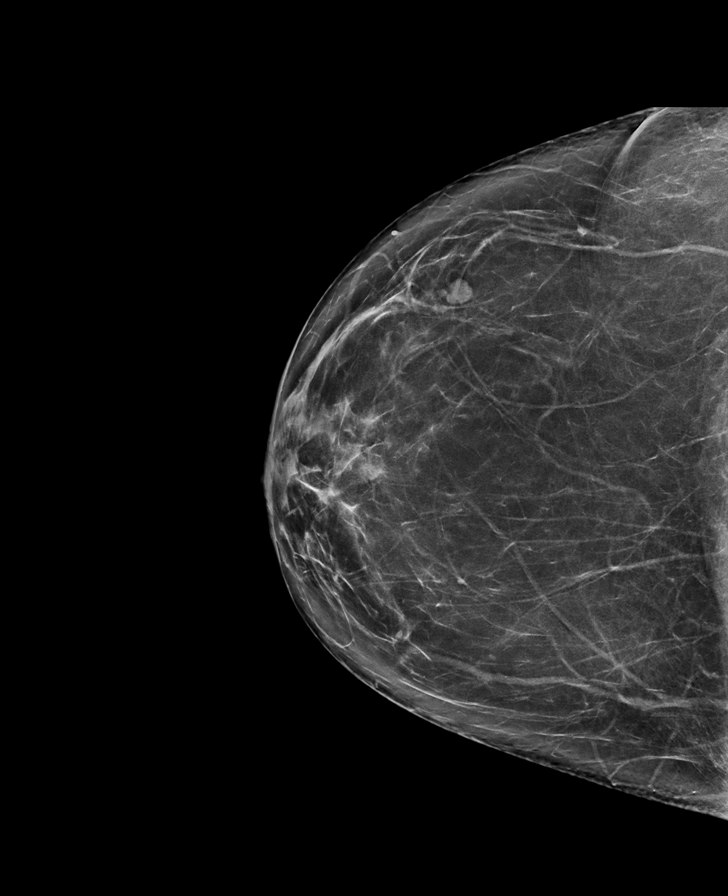

[L CC synth-2D]
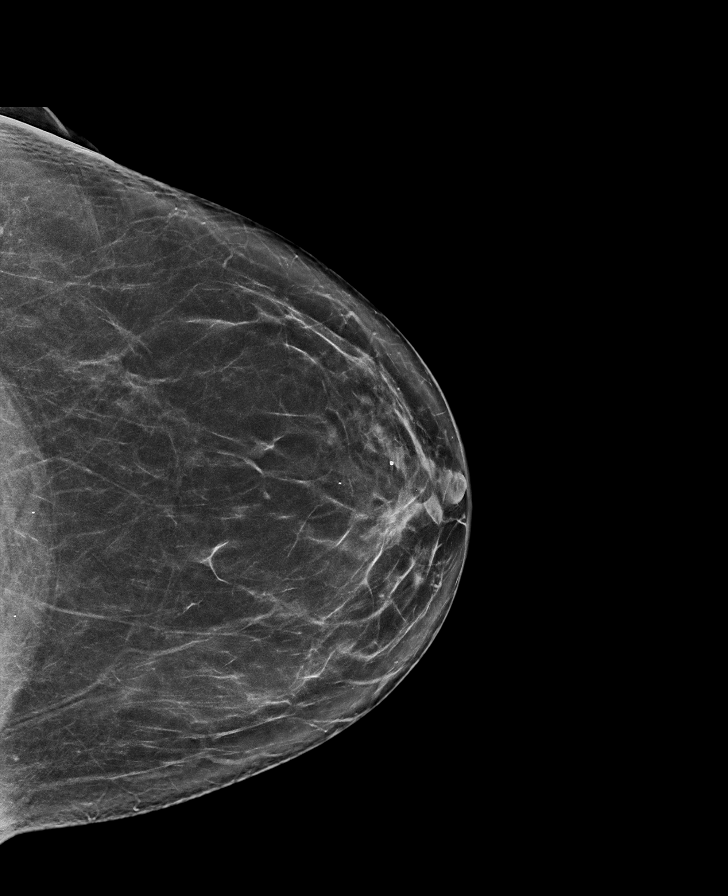

[L CC tomo · tomo slice 41/81.0]
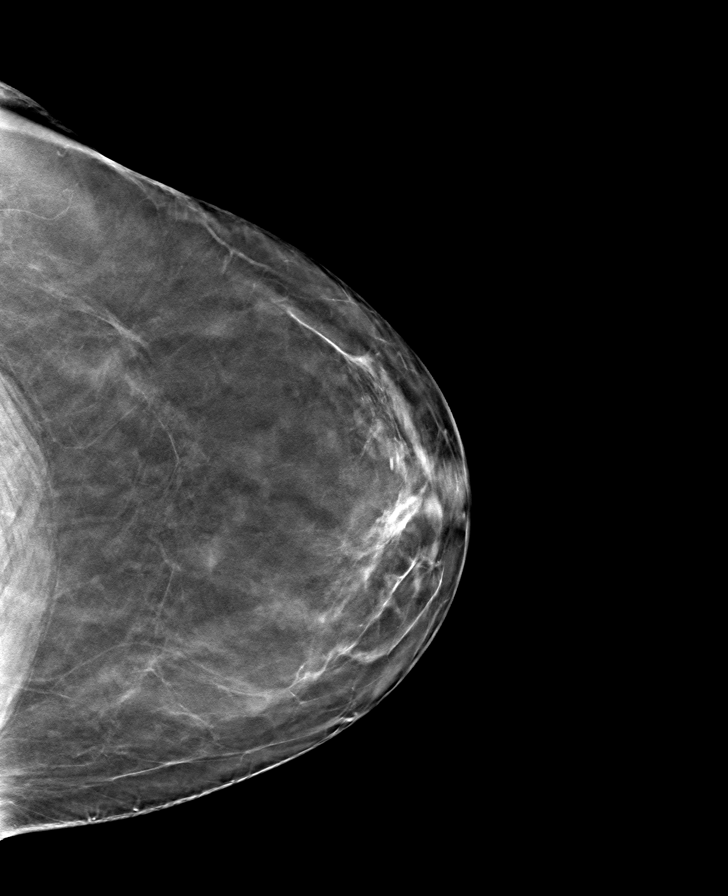

[R CC tomo · tomo slice 40/79.0]
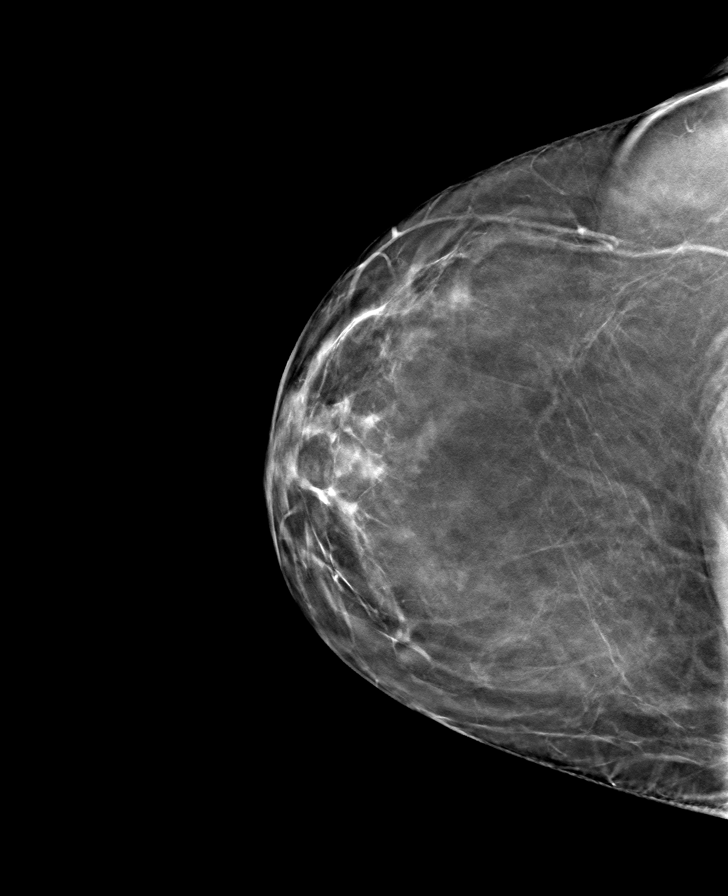

[R MLO tomo · tomo slice 41/80.0]
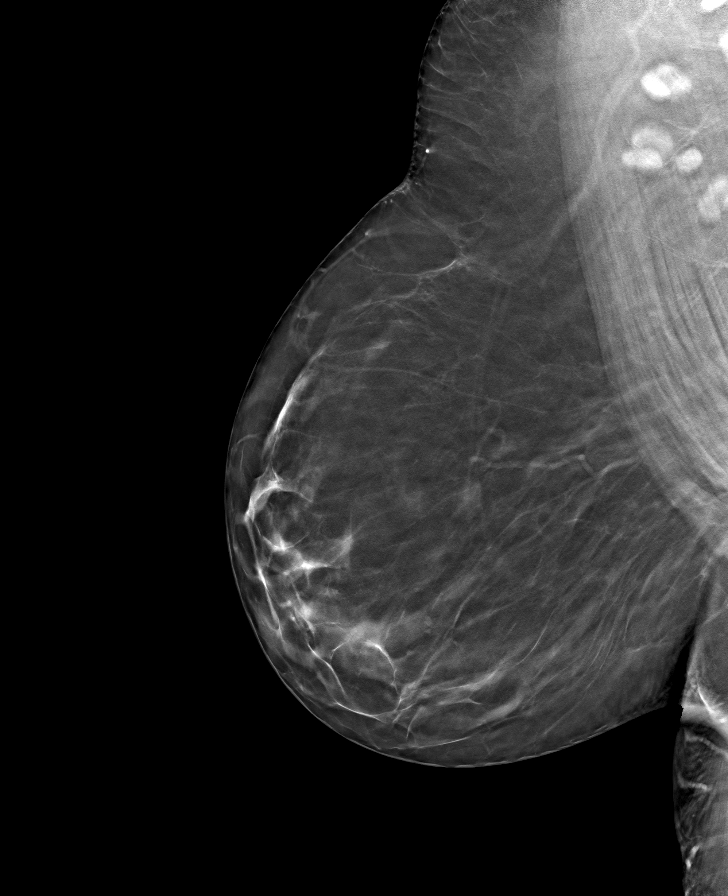

[L MLO tomo · tomo slice 40/79.0]
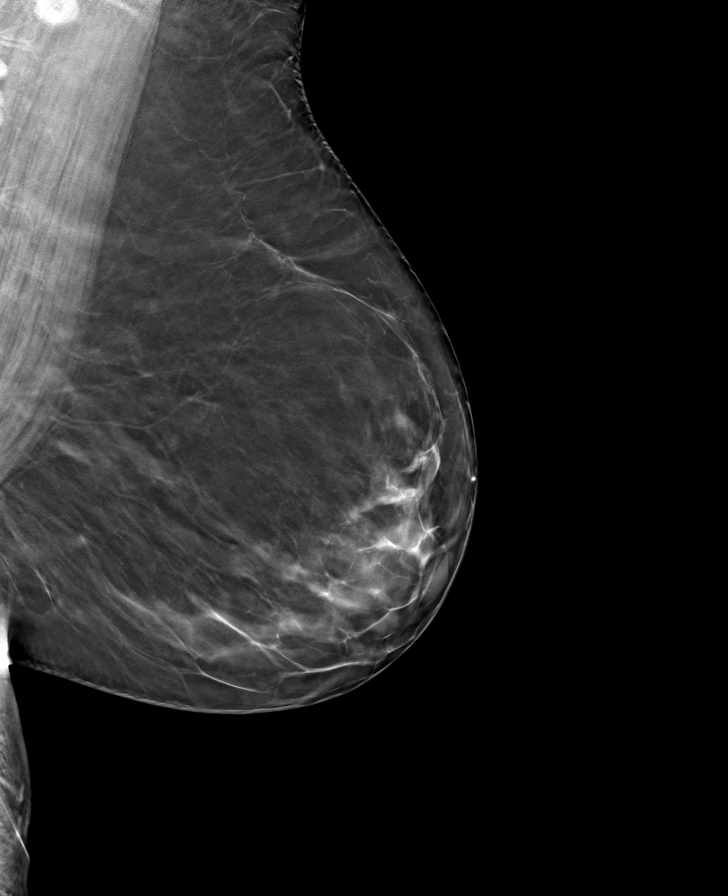

[8 of 24 positions shown; findings below may reference images not displayed]

ACR Breast Density Category b: There are scattered areas of
fibroglandular density.
FINDINGS: There are no findings suspicious for malignancy. The images were
evaluated with computer-aided detection.
IMPRESSION: No mammographic evidence of malignancy. A result letter of this
screening mammogram will be mailed directly to the patient.

RECOMMENDATION:
Screening mammogram in one year. (Code:[id number])

BI-RADS CATEGORY  1: Negative.

## 2023-03-31 ENCOUNTER — Other Ambulatory Visit: Payer: Self-pay | Admitting: Internal Medicine

## 2023-03-31 ENCOUNTER — Ambulatory Visit: Payer: Commercial Managed Care - PPO | Admitting: Internal Medicine

## 2023-03-31 VITALS — BP 128/84 | HR 90 | Temp 98.2°F | Ht 66.0 in | Wt 198.0 lb

## 2023-03-31 DIAGNOSIS — R7303 Prediabetes: Secondary | ICD-10-CM

## 2023-03-31 DIAGNOSIS — E782 Mixed hyperlipidemia: Secondary | ICD-10-CM

## 2023-03-31 DIAGNOSIS — L309 Dermatitis, unspecified: Secondary | ICD-10-CM

## 2023-03-31 DIAGNOSIS — F3289 Other specified depressive episodes: Secondary | ICD-10-CM

## 2023-03-31 DIAGNOSIS — G43809 Other migraine, not intractable, without status migrainosus: Secondary | ICD-10-CM

## 2023-03-31 DIAGNOSIS — R911 Solitary pulmonary nodule: Secondary | ICD-10-CM

## 2023-03-31 DIAGNOSIS — I10 Essential (primary) hypertension: Secondary | ICD-10-CM

## 2023-03-31 DIAGNOSIS — F419 Anxiety disorder, unspecified: Secondary | ICD-10-CM

## 2023-03-31 DIAGNOSIS — Z136 Encounter for screening for cardiovascular disorders: Secondary | ICD-10-CM

## 2023-03-31 LAB — COMPREHENSIVE METABOLIC PANEL
ALT: 22 U/L (ref 0–35)
AST: 18 U/L (ref 0–37)
Albumin: 4.3 g/dL (ref 3.5–5.2)
Alkaline Phosphatase: 74 U/L (ref 39–117)
BUN: 15 mg/dL (ref 6–23)
CO2: 28 meq/L (ref 19–32)
Calcium: 9.2 mg/dL (ref 8.4–10.5)
Chloride: 101 meq/L (ref 96–112)
Creatinine, Ser: 0.69 mg/dL (ref 0.40–1.20)
GFR: 93.37 mL/min (ref >60.00)
Glucose, Bld: 116 mg/dL — ABNORMAL HIGH (ref 70–99)
Potassium: 3.6 meq/L (ref 3.5–5.1)
Sodium: 137 meq/L (ref 135–145)
Total Bilirubin: 0.7 mg/dL (ref 0.2–1.2)
Total Protein: 7.3 g/dL (ref 6.0–8.3)

## 2023-03-31 LAB — LIPID PANEL
Cholesterol: 202 mg/dL — ABNORMAL HIGH (ref 0–200)
HDL: 49.4 mg/dL (ref >39.00)
LDL Cholesterol: 128 mg/dL — ABNORMAL HIGH (ref 0–99)
NonHDL: 152.87
Total CHOL/HDL Ratio: 4
Triglycerides: 125 mg/dL (ref 0.0–149.0)
VLDL: 25 mg/dL (ref 0.0–40.0)

## 2023-03-31 LAB — HEMOGLOBIN A1C: Hgb A1c MFr Bld: 5.7 % (ref 4.6–6.5)

## 2023-03-31 MED ORDER — TRIAMCINOLONE ACETONIDE 0.1 % EX CREA: 1.0000 | TOPICAL_CREAM | Freq: Two times a day (BID) | CUTANEOUS | 1 refills | Status: AC | PRN

## 2023-03-31 MED ORDER — ALPRAZOLAM 0.5 MG PO TABS: 0.5000 mg | ORAL_TABLET | Freq: Every evening | ORAL | 5 refills | Status: DC | PRN

## 2023-03-31 MED ORDER — POTASSIUM CHLORIDE CRYS ER 20 MEQ PO TBCR: 20.0000 meq | EXTENDED_RELEASE_TABLET | Freq: Two times a day (BID) | ORAL | 1 refills | Status: DC

## 2023-03-31 NOTE — Assessment & Plan Note (Signed)
Chronic BP controlled Continue losartan-hydrochlorothiazide 100-25 mg daily, amlodipine 5 mg daily Cmp, cbc

## 2023-03-31 NOTE — Assessment & Plan Note (Signed)
Chronic Regular exercise and healthy diet encouraged Check lipid panel , cmp Continue lifestyle control

## 2023-03-31 NOTE — Assessment & Plan Note (Signed)
Chronic Lab Results  Component Value Date   HGBA1C 5.6 09/23/2022   Check a1c Low sugar / carb diet Stressed regular exercise

## 2023-03-31 NOTE — Assessment & Plan Note (Addendum)
Chronic Controlled, Stable Continue Wellbutrin xl 150 mg daily, Effexor 75 mg daily

## 2023-03-31 NOTE — Assessment & Plan Note (Addendum)
Chronic Controlled, Stable Continue Wellbutrin xl 150 mg daily, Effexor 75 mg daily, alprazolam 0.5 mg daily as needed

## 2023-03-31 NOTE — Assessment & Plan Note (Signed)
chronic Infrequent migraines Continue Imitrex 50 mg daily as needed, Zofran 4 mg as needed

## 2023-04-02 ENCOUNTER — Ambulatory Visit (HOSPITAL_COMMUNITY)
Admission: RE | Admit: 2023-04-02 | Discharge: 2023-04-02 | Disposition: A | Discharge status: Home / Self Care | Payer: Commercial Managed Care - PPO | Source: Ambulatory Visit | Attending: Internal Medicine | Admitting: Internal Medicine

## 2023-04-02 DIAGNOSIS — Z136 Encounter for screening for cardiovascular disorders: Secondary | ICD-10-CM | POA: Insufficient documentation

## 2023-04-12 NOTE — Addendum Note (Signed)
Addended by: Pincus Sanes on: 04/12/2023 11:51 AM   Modules accepted: Orders

## 2023-04-24 ENCOUNTER — Telehealth: Payer: Commercial Managed Care - PPO | Admitting: Physician Assistant

## 2023-04-24 DIAGNOSIS — J069 Acute upper respiratory infection, unspecified: Secondary | ICD-10-CM | POA: Diagnosis not present

## 2023-04-24 NOTE — Patient Instructions (Signed)
Ernest Mallick, thank you for joining [name], PA-C for today's virtual visit.  While this provider is not your primary care provider (PCP), if your PCP is located in our provider database this encounter information will be shared with them immediately following your visit.   A [facility] [facility] MyChart account gives you access to today's visit and all your visits, tests, and labs performed at [facility] " click here if you don't have a [place] MyChart account or go to [email or web address]  Consent: (Patient) Ernest Mallick provided verbal consent for this virtual visit at the beginning of the encounter.  Current Medications:  Current Outpatient Medications:    ALPRAZolam (XANAX) 0.5 MG tablet, Take 1 tablet (0.5 mg total) by mouth at bedtime as needed for anxiety., Disp: 15 tablet, Rfl: 5   amLODipine (NORVASC) 5 MG tablet, Take 1 tablet (5 mg total) by mouth daily., Disp: 90 tablet, Rfl: 2   buPROPion (WELLBUTRIN XL) 150 MG 24 hr tablet, Take 1 tablet (150 mg total) by mouth daily., Disp: 90 tablet, Rfl: 0   cetirizine (ZYRTEC) 10 MG tablet, Take 1 tablet (10 mg total) by mouth daily., Disp: 30 tablet, Rfl: 11   fluticasone (FLONASE) 50 MCG/ACT nasal spray, Place 2 sprays into both nostrils daily., Disp: 16 g, Rfl: 6   ketorolac (ACULAR) 0.5 % ophthalmic solution, Place 1 drop into both eyes every 6 (six) hours., Disp: 5 mL, Rfl: 0   losartan-hydrochlorothiazide (HYZAAR) 100-25 MG tablet, TAKE 1 TABLET BY MOUTH EVERY DAY, Disp: 90 tablet, Rfl: 1   meloxicam (MOBIC) 7.5 MG tablet, Take 1 Tablet By Mouth Every 12 Hours prn, Disp: 60 tablet, Rfl: 5   Multiple Vitamin (MULTIVITAMIN WITH MINERALS) TABS tablet, Take 1 tablet by mouth daily., Disp: , Rfl:    ondansetron (ZOFRAN) 4 MG tablet, Take 1 tablet (4 mg total) by mouth every 8 (eight) hours as needed for nausea or vomiting., Disp: 20 tablet, Rfl: 0   potassium chloride SA (KLOR-CON M20) 20 MEQ tablet, Take 1  tablet (20 mEq total) by mouth 2 (two) times daily., Disp: 180 tablet, Rfl: 1   SUMAtriptan (IMITREX) 50 MG tablet, Take 1 tablet (50 mg total) by mouth once as needed for up to 1 dose for migraine. May repeat in 2 hours if headache persists or recurs., Disp: 10 tablet, Rfl: 5   triamcinolone cream (KENALOG) 0.1 %, Apply 1 Application topically 2 (two) times daily as needed (eczema)., Disp: 454 g, Rfl: 1   venlafaxine XR (EFFEXOR-XR) 75 MG 24 hr capsule, TAKE 1 CAPSULE BY MOUTH DAILY WITH BREAKFAST., Disp: 90 capsule, Rfl: 1   Medications ordered in this encounter:  No orders of the defined types were placed in this encounter.    *If you need refills on other medications prior to your next appointment, please contact your pharmacy*  Follow-Up: Call back or seek an in-person evaluation if the symptoms worsen or if the condition fails to improve as anticipated.  [facility] [phone]  Other Instructions Upper Respiratory Infection, Adult An upper respiratory infection (URI) is a common viral infection of the nose, throat, and upper air passages that lead to the lungs. The most common type of URI is the common cold. URIs usually get better on their own, without medical treatment. What are the causes? A URI is caused by a virus. You may catch a virus by: Breathing in droplets from an infected person's cough or sneeze. Touching something that has  been exposed to the virus (is contaminated) and then touching your mouth, nose, or eyes. What increases the risk? You are more likely to get a URI if: You are very young or very old. You have close contact with others, such as at work, school, or a health care facility. You smoke. You have long-term (chronic) heart or lung disease. You have a weakened disease-fighting system (immune system). You have nasal allergies or asthma. You are experiencing a lot of stress. You have poor nutrition. What are the signs or symptoms? A URI  usually involves some of the following symptoms: Runny or stuffy (congested) nose. Cough. Sneezing. Sore throat. Headache. Fatigue. Fever. Loss of appetite. Pain in your forehead, behind your eyes, and over your cheekbones (sinus pain). Muscle aches. Redness or irritation of the eyes. Pressure in the ears or face. How is this diagnosed? This condition may be diagnosed based on your medical history and symptoms, and a physical exam. Your health care provider may use a swab to take a mucus sample from your nose (nasal swab). This sample can be tested to determine what virus is causing the illness. How is this treated? URIs usually get better on their own within 7-10 days. Medicines cannot cure URIs, but your health care provider may recommend certain medicines to help relieve symptoms, such as: Over-the-counter cold medicines. Cough suppressants. Coughing is a type of defense against infection that helps to clear the respiratory system, so take these medicines only as recommended by your health care provider. Fever-reducing medicines. Follow these instructions at home: Activity Rest as needed. If you have a fever, stay home from work or school until your fever is gone or until your health care provider says your URI cannot spread to other people (is no longer contagious). Your health care provider may have you wear a face mask to prevent your infection from spreading. Relieving symptoms Gargle with a mixture of salt and water 3-4 times a day or as needed. To make salt water, completely dissolve -1 tsp (3-6 g) of salt in 1 cup (237 mL) of warm water. Use a cool-mist humidifier to add moisture to the air. This can help you breathe more easily. Eating and drinking  Drink enough fluid to keep your urine pale yellow. Eat soups and other clear broths. General instructions  Take over-the-counter and prescription medicines only as told by your health care provider. These include cold  medicines, fever reducers, and cough suppressants. Do not use any products that contain nicotine or tobacco. These products include cigarettes, chewing tobacco, and vaping devices, such as e-cigarettes. If you need help quitting, ask your health care provider. Stay away from secondhand smoke. Stay up to date on all immunizations, including the yearly (annual) flu vaccine. Keep all follow-up visits. This is important. How to prevent the spread of infection to others URIs can be contagious. To prevent the infection from spreading: Wash your hands with soap and water for at least 20 seconds. If soap and water are not available, use hand sanitizer. Avoid touching your mouth, face, eyes, or nose. Cough or sneeze into a tissue or your sleeve or elbow instead of into your hand or into the air.  Contact a health care provider if: You are getting worse instead of better. You have a fever or chills. Your mucus is brown or red. You have yellow or brown discharge coming from your nose. You have pain in your face, especially when you bend forward. You have swollen neck glands. You  have pain while swallowing. You have white areas in the back of your throat. Get help right away if: You have shortness of breath that gets worse. You have severe or persistent: Headache. Ear pain. Sinus pain. Chest pain. You have chronic lung disease along with any of the following: Making high-pitched whistling sounds when you breathe, most often when you breathe out (wheezing). Prolonged cough (more than 14 days). Coughing up blood. A change in your usual mucus. You have a stiff neck. You have changes in your: Vision. Hearing. Thinking. Mood. These symptoms may be an emergency. Get help right away. Call 911. Do not wait to see if the symptoms will go away. Do not drive yourself to the hospital. Summary An upper respiratory infection (URI) is a common infection of the nose, throat, and upper air passages that  lead to the lungs. A URI is caused by a virus. URIs usually get better on their own within 7-10 days. Medicines cannot cure URIs, but your health care provider may recommend certain medicines to help relieve symptoms. This information is not intended to replace advice given to you by your health care provider. Make sure you discuss any questions you have with your health care provider. Document Revised: 11/14/2020 Document Reviewed: 11/14/2020 Elsevier Patient Education  2024 Elsevier Inc.    If you have been instructed to have an in-person evaluation today at a local Urgent Care facility, please use the link below. It will take you to a list of all of our available [facility], including address, phone number and hours of operation. Please do not delay care.  [facility]  If you or a family member do not have a primary care provider, use the link below to schedule a visit and establish care. When you choose a [facility] primary care physician or advanced practice provider, you gain a long-term partner in health. Find a Primary Care Provider  Learn more about [facility] in-office and virtual care options:  - Get Care Now

## 2023-04-24 NOTE — Progress Notes (Signed)
Virtual Visit Consent   Andrea Oneal, you are scheduled for a virtual visit with a [facility] provider today. Just as with appointments in the office, your consent must be obtained to participate. Your consent will be active for this visit and any virtual visit you may have with one of our providers in the next 365 days. If you have a MyChart account, a copy of this consent can be sent to you electronically.  As this is a virtual visit, video technology does not allow for your provider to perform a traditional examination. This may limit your provider's ability to fully assess your condition. If your provider identifies any concerns that need to be evaluated in person or the need to arrange testing (such as labs, EKG, etc.), we will make arrangements to do so. Although advances in technology are sophisticated, we cannot ensure that it will always work on either your end or our end. If the connection with a video visit is poor, the visit may have to be switched to a telephone visit. With either a video or telephone visit, we are not always able to ensure that we have a secure connection.  By engaging in this virtual visit, you consent to the provision of healthcare and authorize for your insurance to be billed (if applicable) for the services provided during this visit. Depending on your insurance coverage, you may receive a charge related to this service.  I need to obtain your verbal consent now. Are you willing to proceed with your visit today? Andrea Oneal has provided verbal consent on 04/24/2023 for a virtual visit (video or telephone). [name], PA-C  Date: 04/24/2023 5:45 PM  Virtual Visit via Video Note   I, [name], connected with  Andrea Oneal  ([id number], 14-Aug-1960) on 04/24/23 at  5:30 PM EST by a video-enabled telemedicine application and verified that I am speaking with the correct person using two identifiers.  Location: Patient: Virtual Visit Location  Patient: Home Provider: Virtual Visit Location Provider: Home Office   I discussed the limitations of evaluation and management by telemedicine and the availability of in person appointments. The patient expressed understanding and agreed to proceed.    History of Present Illness: Andrea Oneal is a 62 y.o. who identifies as a female who was assigned female at birth, and is being seen today for URI symptoms.  HPI: URI  This is a new problem. The current episode started 1 to 4 weeks ago. The problem has been gradually improving. There has been no fever. Associated symptoms include congestion, coughing, headaches, nausea, rhinorrhea, sinus pain and a sore throat. Pertinent negatives include no ear pain, plugged ear sensation or wheezing. Associated symptoms comments: Fatigue, hoarse voice. She has tried acetaminophen, increased fluids and sleep for the symptoms. The treatment provided no relief.     Problems:  Patient Active Problem List   Diagnosis Date Noted   Nose disorder 03/17/2022   Hyperlipidemia 09/18/2021   Thyroid nodule 07/27/2020   Vertigo 08/10/2019   Hypertension 04/01/2019   Eczema 03/05/2018   Prediabetes 03/05/2018   Internal hemorrhoid 03/18/2017   IBS (irritable bowel syndrome) 03/18/2017   Anxiety 07/01/2016   Depression 07/01/2016   Allergic rhinitis 07/01/2016   Migraines 07/01/2016   Obese 07/01/2016    Allergies:  Allergies  Allergen Reactions   Beef (Bovine) Protein Hives and Itching   Bactrim [Sulfamethoxazole-Trimethoprim] Other (See Comments)    Hives, rash, nausea   Medications:  Current Outpatient  Medications:    ALPRAZolam (XANAX) 0.5 MG tablet, Take 1 tablet (0.5 mg total) by mouth at bedtime as needed for anxiety., Disp: 15 tablet, Rfl: 5   amLODipine (NORVASC) 5 MG tablet, Take 1 tablet (5 mg total) by mouth daily., Disp: 90 tablet, Rfl: 2   buPROPion (WELLBUTRIN XL) 150 MG 24 hr tablet, Take 1 tablet (150 mg total) by mouth daily., Disp: 90  tablet, Rfl: 0   cetirizine (ZYRTEC) 10 MG tablet, Take 1 tablet (10 mg total) by mouth daily., Disp: 30 tablet, Rfl: 11   fluticasone (FLONASE) 50 MCG/ACT nasal spray, Place 2 sprays into both nostrils daily., Disp: 16 g, Rfl: 6   ketorolac (ACULAR) 0.5 % ophthalmic solution, Place 1 drop into both eyes every 6 (six) hours., Disp: 5 mL, Rfl: 0   losartan-hydrochlorothiazide (HYZAAR) 100-25 MG tablet, TAKE 1 TABLET BY MOUTH EVERY DAY, Disp: 90 tablet, Rfl: 1   meloxicam (MOBIC) 7.5 MG tablet, Take 1 Tablet By Mouth Every 12 Hours prn, Disp: 60 tablet, Rfl: 5   Multiple Vitamin (MULTIVITAMIN WITH MINERALS) TABS tablet, Take 1 tablet by mouth daily., Disp: , Rfl:    ondansetron (ZOFRAN) 4 MG tablet, Take 1 tablet (4 mg total) by mouth every 8 (eight) hours as needed for nausea or vomiting., Disp: 20 tablet, Rfl: 0   potassium chloride SA (KLOR-CON M20) 20 MEQ tablet, Take 1 tablet (20 mEq total) by mouth 2 (two) times daily., Disp: 180 tablet, Rfl: 1   SUMAtriptan (IMITREX) 50 MG tablet, Take 1 tablet (50 mg total) by mouth once as needed for up to 1 dose for migraine. May repeat in 2 hours if headache persists or recurs., Disp: 10 tablet, Rfl: 5   triamcinolone cream (KENALOG) 0.1 %, Apply 1 Application topically 2 (two) times daily as needed (eczema)., Disp: 454 g, Rfl: 1   venlafaxine XR (EFFEXOR-XR) 75 MG 24 hr capsule, TAKE 1 CAPSULE BY MOUTH DAILY WITH BREAKFAST., Disp: 90 capsule, Rfl: 1  Observations/Objective: Patient is well-developed, well-nourished in no acute distress.  Resting comfortably at home.  Head is normocephalic, atraumatic.  No labored breathing.  Speech is clear and coherent with logical content.  Patient is alert and oriented at baseline.    Assessment and Plan: 1. Viral URI with cough (Primary)  - Suspect viral URI, improving - Symptomatic medications of choice over the counter as needed - Push fluids - Rest - Patient was curious if still contagious, symptoms  present for over a week, improving, and no fevers; risk of spread minimal at this time - Seek further evaluation if symptoms change or worsen   Follow Up Instructions: I discussed the assessment and treatment plan with the patient. The patient was provided an opportunity to ask questions and all were answered. The patient agreed with the plan and demonstrated an understanding of the instructions.  A copy of instructions were sent to the patient via MyChart unless otherwise noted below.    The patient was advised to call back or seek an in-person evaluation if the symptoms worsen or if the condition fails to improve as anticipated.    [name], PA-C

## 2023-04-27 ENCOUNTER — Other Ambulatory Visit: Payer: Self-pay | Admitting: Internal Medicine

## 2023-04-30 ENCOUNTER — Encounter: Payer: Self-pay | Admitting: Physician Assistant

## 2023-04-30 DIAGNOSIS — R051 Acute cough: Secondary | ICD-10-CM

## 2023-04-30 MED ORDER — BENZONATATE 100 MG PO CAPS: 100.0000 mg | ORAL_CAPSULE | Freq: Three times a day (TID) | ORAL | 0 refills | Status: DC | PRN

## 2023-05-14 ENCOUNTER — Encounter: Payer: Self-pay | Admitting: Internal Medicine

## 2023-05-14 DIAGNOSIS — Z1211 Encounter for screening for malignant neoplasm of colon: Secondary | ICD-10-CM

## 2023-05-15 ENCOUNTER — Other Ambulatory Visit: Payer: Self-pay

## 2023-05-15 MED ORDER — VENLAFAXINE HCL ER 75 MG PO CP24: 75.0000 mg | ORAL_CAPSULE | Freq: Every day | ORAL | 1 refills | Status: DC

## 2023-05-20 ENCOUNTER — Other Ambulatory Visit: Payer: Self-pay | Admitting: Internal Medicine

## 2023-05-21 MED ORDER — BUPROPION HCL ER (XL) 150 MG PO TB24: 150.0000 mg | ORAL_TABLET | Freq: Every day | ORAL | 0 refills | Status: DC

## 2023-05-26 ENCOUNTER — Ambulatory Visit
Admission: RE | Admit: 2023-05-26 | Discharge: 2023-05-26 | Disposition: A | Discharge status: Home / Self Care | Payer: Commercial Managed Care - PPO | Source: Ambulatory Visit | Attending: Internal Medicine

## 2023-05-26 VITALS — BP 139/85 | HR 78 | Temp 98.3°F | Resp 17

## 2023-05-26 DIAGNOSIS — J209 Acute bronchitis, unspecified: Secondary | ICD-10-CM | POA: Diagnosis present

## 2023-05-26 DIAGNOSIS — Z1152 Encounter for screening for COVID-19: Secondary | ICD-10-CM | POA: Insufficient documentation

## 2023-05-26 DIAGNOSIS — Z87891 Personal history of nicotine dependence: Secondary | ICD-10-CM | POA: Diagnosis not present

## 2023-05-26 MED ORDER — ALBUTEROL SULFATE (2.5 MG/3ML) 0.083% IN NEBU
2.5000 mg | INHALATION_SOLUTION | Freq: Once | RESPIRATORY_TRACT | Status: AC
  Administered 2023-05-26: 2.5 mg via RESPIRATORY_TRACT

## 2023-05-26 MED ORDER — PREDNISONE 20 MG PO TABS: 40.0000 mg | ORAL_TABLET | Freq: Every day | ORAL | 0 refills | Status: AC

## 2023-05-26 MED ORDER — METHYLPREDNISOLONE ACETATE 80 MG/ML IJ SUSP
40.0000 mg | Freq: Once | INTRAMUSCULAR | Status: AC
  Administered 2023-05-26: 40 mg via INTRAMUSCULAR

## 2023-05-26 MED ORDER — ALBUTEROL SULFATE HFA 108 (90 BASE) MCG/ACT IN AERS: 2.0000 | INHALATION_SPRAY | Freq: Four times a day (QID) | RESPIRATORY_TRACT | 0 refills | Status: DC | PRN

## 2023-05-26 MED ORDER — PROMETHAZINE-DM 6.25-15 MG/5ML PO SYRP: 5.0000 mL | ORAL_SOLUTION | Freq: Every evening | ORAL | 0 refills | Status: DC | PRN

## 2023-05-26 NOTE — ED Provider Notes (Signed)
[facility]    CSN: [id number] Arrival date & time: 05/26/23  1533      History   Chief Complaint Chief Complaint  Patient presents with   Sore Throat    Congestion, coughing - Entered by patient    HPI Andrea Oneal is a 63 y.o. female.   Andrea Oneal is a 63 y.o. female presenting for chief complaint of cough, congestion, sore throat, and intermittent shortness of breath that started 2 days ago.  She states she has been sick for the last 4 to 5 weeks with cough and cold symptoms which seem to have improved for approximately 1 week and then returned 2 days ago.  Cough is somewhat productive with green/yellow phlegm.  Denies chest pain, nausea, vomiting, diarrhea, abdominal pain, dizziness, fever, chills, body aches, headache, and rash.  Her husband is sick with similar symptoms.  Former smoker, intermittently smokes marijuana, denies other drug use.  Denies history of asthma/other chronic respiratory problems.  Denies recent antibiotic or steroid use.  She is taking over-the-counter medications to help with symptoms without much relief.    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      Home Medications    Prior to  Admission medications   Medication Sig Start Date End Date Taking? Authorizing Provider  ALPRAZolam Prudy Feeler) 0.5 MG tablet Take 1 tablet (0.5 mg total) by mouth at bedtime as needed for anxiety. 03/31/23   Pincus Sanes, MD  amLODipine (NORVASC) 5 MG tablet Take 1 tablet (5 mg total) by mouth daily. 09/23/22   Pincus Sanes, MD  benzonatate (TESSALON) 100 MG capsule Take 1-2 capsules (100-200 mg total) by mouth 3 (three) times daily as needed. Patient not taking: Reported on 05/26/2023 04/30/23   [name], PA-C  buPROPion (WELLBUTRIN XL) 150 MG 24 hr tablet Take 1 tablet (150 mg total) by mouth daily. 05/21/23   Pincus Sanes, MD  cetirizine (ZYRTEC) 10 MG tablet Take 1 tablet (10 mg total) by mouth daily. 01/15/23   Ashok Croon, MD  fluticasone (FLONASE) 50 MCG/ACT nasal spray Place 2 sprays into both nostrils daily. 01/15/23   Ashok Croon, MD  ketorolac (ACULAR) 0.5 % ophthalmic solution Place 1 drop into both eyes every 6 (six) hours. 08/01/22   Ellsworth Lennox, PA-C  losartan-hydrochlorothiazide (HYZAAR) 100-25 MG tablet TAKE 1 TABLET BY MOUTH EVERY DAY 03/23/23   Pincus Sanes, MD  meloxicam (MOBIC) 7.5 MG tablet Take 1 Tablet By Mouth Every 12 Hours prn 09/19/21   Pincus Sanes, MD  Multiple Vitamin (MULTIVITAMIN WITH  MINERALS) TABS tablet Take 1 tablet by mouth daily.    [provider]  ondansetron (ZOFRAN) 4 MG tablet Take 1 tablet (4 mg total) by mouth every 8 (eight) hours as needed for nausea or vomiting. 02/24/22   [name], FNP  potassium chloride SA (KLOR-CON M20) 20 MEQ tablet Take 1 tablet (20 mEq total) by mouth 2 (two) times daily. 03/31/23   Pincus Sanes, MD  SUMAtriptan (IMITREX) 50 MG tablet Take 1 tablet (50 mg total) by mouth once as needed for up to 1 dose for migraine. May repeat in 2 hours if headache persists or recurs. 10/10/20   Pincus Sanes, MD  triamcinolone cream (KENALOG) 0.1 % Apply 1 Application topically 2 (two) times daily as needed  (eczema). 03/31/23   Pincus Sanes, MD  venlafaxine XR (EFFEXOR-XR) 75 MG 24 hr capsule Take 1 capsule (75 mg total) by mouth daily with breakfast. 05/15/23   Pincus Sanes, MD    Family History Family History  Problem Relation Age of Onset   Arthritis Mother    Diabetes Mother    Depression Mother    Arthritis Father    Heart disease Father    Hypertension Father    Depression Brother    Arthritis Maternal Grandmother    Diabetes Maternal Grandfather    Arthritis Paternal Grandmother    Colon cancer Neg Hx    Colon polyps Neg Hx    Esophageal cancer Neg Hx    Stomach cancer Neg Hx    Rectal cancer Neg Hx     Social History Social History   Tobacco Use   Smoking status: Former    Current packs/day: 0.00    Average packs/day: 0.3 packs/day for 16.0 years (4.0 ttl pk-yrs)    Types: Cigarettes    Start date: 76    Quit date: 1994    Years since quitting: 31.0   Smokeless tobacco: Never  Vaping Use   Vaping status: Every Day  Substance Use Topics   Alcohol use: Yes    Alcohol/week: 7.0 standard drinks of alcohol    Types: 7 Glasses of wine per week   Drug use: Yes    Types: Marijuana     Allergies   Beef (bovine) protein and Bactrim [sulfamethoxazole-trimethoprim]   Review of Systems Review of Systems Per HPI  Physical Exam Triage Vital Signs ED Triage Vitals  Encounter Vitals Group     BP 05/26/23 1630 139/85     Systolic BP Percentile --      Diastolic BP Percentile --      Pulse Rate 05/26/23 1630 78     Resp 05/26/23 1630 17     Temp 05/26/23 1630 98.3 F (36.8 C)     Temp Source 05/26/23 1630 Oral     SpO2 05/26/23 1630 95 %     Weight --      Height --      Head Circumference --      Peak Flow --      Pain Score 05/26/23 1629 6     Pain Loc --      Pain Education --      Exclude from Growth Chart --    No data found.  Updated Vital Signs BP 139/85 (BP Location: Right Arm)   Pulse 78   Temp 98.3 F (36.8 C) (Oral)   Resp 17    SpO2 95%   Visual Acuity Right Eye Distance:   Left Eye Distance:  Bilateral Distance:    Right Eye Near:   Left Eye Near:    Bilateral Near:     Physical Exam Vitals and nursing note reviewed.  Constitutional:      Appearance: She is not ill-appearing or toxic-appearing.  HENT:     Head: Normocephalic and atraumatic.     Right Ear: Hearing, tympanic membrane, ear canal and external ear normal.     Left Ear: Hearing, tympanic membrane, ear canal and external ear normal.     Nose: Nose normal.     Mouth/Throat:     Lips: Pink.     Mouth: Mucous membranes are moist. No injury or oral lesions.     Dentition: Normal dentition.     Tongue: No lesions.     Pharynx: Oropharynx is clear. Uvula midline. No pharyngeal swelling, oropharyngeal exudate, posterior oropharyngeal erythema, uvula swelling or postnasal drip.     Tonsils: No tonsillar exudate.  Eyes:     General: Lids are normal. Vision grossly intact. Gaze aligned appropriately.     Extraocular Movements: Extraocular movements intact.     Conjunctiva/sclera: Conjunctivae normal.  Neck:     Trachea: Trachea and phonation normal.  Cardiovascular:     Rate and Rhythm: Normal rate and regular rhythm.     Heart sounds: Normal heart sounds, S1 normal and S2 normal.  Pulmonary:     Effort: Pulmonary effort is normal. No respiratory distress.     Breath sounds: Normal air entry. Wheezing (Expiratory wheezes heard throughout all lung fields bilaterally.) present. No rhonchi or rales.     Comments: Speaking in full sentences without increased respiratory effort or difficulty. Chest:     Chest wall: No tenderness.  Musculoskeletal:     Cervical back: Neck supple.  Lymphadenopathy:     Cervical: No cervical adenopathy.  Skin:    General: Skin is warm and dry.     Capillary Refill: Capillary refill takes less than 2 seconds.     Findings: No rash.  Neurological:     General: No focal deficit present.     Mental Status: She is  alert and oriented to person, place, and time. Mental status is at baseline.     Cranial Nerves: No dysarthria or facial asymmetry.  Psychiatric:        Mood and Affect: Mood normal.        Speech: Speech normal.        Behavior: Behavior normal.        Thought Content: Thought content normal.        Judgment: Judgment normal.      UC Treatments / Results  Labs (all labs ordered are listed, but only abnormal results are displayed) Labs Reviewed  SARS CORONAVIRUS 2 (TAT 6-24 HRS)    EKG   Radiology No results found.  Procedures Procedures (including critical care time)  Medications Ordered in UC Medications  albuterol (PROVENTIL) (2.5 MG/3ML) 0.083% nebulizer solution 2.5 mg (has no administration in time range)  methylPREDNISolone acetate (DEPO-MEDROL) injection 40 mg (has no administration in time range)    Initial Impression / Assessment and Plan / UC Course  I have reviewed the triage vital signs and the nursing notes.  Pertinent labs & imaging results that were available during my care of the patient were reviewed by me and considered in my medical decision making (see chart for details).   1. Acute bronchitis Presentation consistent with acute viral bronchitis. Will treat with steroid, bronchodilator, cough suppressants for symptomatic  relief, and expectorants (mucinex) as needed- see AVS.   Interventions in clinic: Albuterol breathing treatment administered with improvement in lung sounds on reassessment.   Patient non-toxic in appearance, vital signs hemodynamically stable, no new oxygen requirement, therefore deferred imaging of the chest. Low suspicion for acute cardiopulmonary abnormality based on history and PE.  Strep/Viral testing: PCR COVID testing is pending, staff will call if positive. She may have antiviral if positive.   Counseled patient on potential for adverse effects with medications prescribed/recommended today, strict ER and return-to-clinic  precautions discussed, patient verbalized understanding.    Final Clinical Impressions(s) / UC Diagnoses   Final diagnoses:  Acute bronchitis, unspecified organism     Discharge Instructions      You have bronchitis which is inflammation of the upper airways in your lungs due to a virus. The following medicines will help with your symptoms.   - Take steroid pills sent to pharmacy as directed. Do not take any other NSAID containing medications such as ibuprofen or naproxen/Aleve while taking prednisone. - You may use albuterol inhaler 1 to 2 puffs every 4-6 hours as needed for cough, shortness of breath, and wheezing. - Take cough medicines as needed. - Use mucinex to break up congestion in nose/chest (guaifenesin 600mg  every 12 hours as needed).   If you develop any new or worsening symptoms or do not improve in the next 2 to 3 days, please return.  If your symptoms are severe, please go to the emergency room. Follow-up with PCP as needed.     ED Prescriptions   None    PDMP not reviewed this encounter.   Carlisle Beers, [place] 05/26/23 [phone]

## 2023-05-26 NOTE — Discharge Instructions (Signed)
You have bronchitis which is inflammation of the upper airways in your lungs due to a virus. The following medicines will help with your symptoms.   - Take steroid pills sent to pharmacy as directed. Do not take any other NSAID containing medications such as ibuprofen or naproxen/Aleve while taking prednisone. - You may use albuterol inhaler 1 to 2 puffs every 4-6 hours as needed for cough, shortness of breath, and wheezing. - Take cough medicines as needed. - Use mucinex to break up congestion in nose/chest (guaifenesin 600mg  every 12 hours as needed).   If you develop any new or worsening symptoms or do not improve in the next 2 to 3 days, please return.  If your symptoms are severe, please go to the emergency room. Follow-up with PCP as needed.

## 2023-05-26 NOTE — ED Triage Notes (Signed)
Pt c/o sore throat, congestion, cough since Sunday. States she was sick in December and symptoms never fully went away

## 2023-05-27 LAB — SARS CORONAVIRUS 2 (TAT 6-24 HRS): SARS Coronavirus 2: NEGATIVE

## 2023-06-26 ENCOUNTER — Encounter: Payer: Self-pay | Admitting: Internal Medicine

## 2023-06-28 ENCOUNTER — Other Ambulatory Visit: Payer: Self-pay | Admitting: Internal Medicine

## 2023-07-15 ENCOUNTER — Encounter: Payer: Self-pay | Admitting: Internal Medicine

## 2023-09-10 ENCOUNTER — Ambulatory Visit (INDEPENDENT_AMBULATORY_CARE_PROVIDER_SITE_OTHER): Payer: Commercial Managed Care - PPO | Admitting: Otolaryngology

## 2023-09-11 ENCOUNTER — Encounter

## 2023-09-14 ENCOUNTER — Telehealth: Payer: Self-pay

## 2023-09-14 ENCOUNTER — Encounter

## 2023-09-14 NOTE — Telephone Encounter (Signed)
 Called patient for pre visit and no answer.  Left message that I would call back in 5 min.

## 2023-09-14 NOTE — Telephone Encounter (Signed)
 No show, unable to reach the patient.  Left a message that the patient needed to call back and reschedule the pre visit or the colonoscopy would be cancelled at the end of today.

## 2023-09-15 ENCOUNTER — Encounter: Payer: Self-pay | Admitting: Gastroenterology

## 2023-09-15 ENCOUNTER — Ambulatory Visit (AMBULATORY_SURGERY_CENTER)

## 2023-09-15 VITALS — Ht 66.0 in | Wt 194.0 lb

## 2023-09-15 DIAGNOSIS — Z1211 Encounter for screening for malignant neoplasm of colon: Secondary | ICD-10-CM

## 2023-09-15 MED ORDER — NA SULFATE-K SULFATE-MG SULF 17.5-3.13-1.6 GM/177ML PO SOLN
1.0000 | Freq: Once | ORAL | 0 refills | Status: AC
Start: 2023-09-15 — End: 2023-09-15

## 2023-09-15 NOTE — Progress Notes (Signed)
 Pre visit completed via phone call; Patient verified name, DOB, and address; No egg or soy allergy known to patient;  No issues known to pt with past sedation with any surgeries or procedures; Patient denies ever being told they had issues or difficulty with intubation; No FH of Malignant Hyperthermia; Pt is not on diet pills; Pt is not on home 02;  Pt is not on blood thinners;  Pt denies issues with constipation;  No A fib or A flutter; Have any cardiac testing pending--NO Insurance verified during PV appt--- UHC Pt can ambulate without assistance;  Pt denies use of chewing tobacco; Discussed diabetic/weight loss medication holds; Discussed NSAID holds; Checked BMI to be less than 50; Pt instructed to use [employer] or GoodRx for a price reduction on prep; Patient's chart reviewed by Rogena Class CNRA prior to previsit and patient appropriate for the [facility]; Pre visit completed and red dot placed by patient's name on their procedure day (on provider's schedule);   Instructions sent to MyChart per her requests;

## 2023-09-23 ENCOUNTER — Other Ambulatory Visit: Payer: Self-pay | Admitting: Internal Medicine

## 2023-09-29 ENCOUNTER — Encounter: Payer: Commercial Managed Care - PPO | Admitting: Internal Medicine

## 2023-10-02 ENCOUNTER — Encounter: Payer: Self-pay | Admitting: Internal Medicine

## 2023-10-02 ENCOUNTER — Encounter: Payer: Self-pay | Admitting: Gastroenterology

## 2023-10-02 ENCOUNTER — Ambulatory Visit: Admitting: Gastroenterology

## 2023-10-02 ENCOUNTER — Other Ambulatory Visit: Payer: Self-pay

## 2023-10-02 VITALS — BP 114/64 | HR 68 | Temp 97.6°F | Resp 13 | Ht 66.0 in | Wt 194.0 lb

## 2023-10-02 DIAGNOSIS — Z1211 Encounter for screening for malignant neoplasm of colon: Secondary | ICD-10-CM | POA: Diagnosis present

## 2023-10-02 DIAGNOSIS — K5730 Diverticulosis of large intestine without perforation or abscess without bleeding: Secondary | ICD-10-CM

## 2023-10-02 DIAGNOSIS — K640 First degree hemorrhoids: Secondary | ICD-10-CM | POA: Diagnosis not present

## 2023-10-02 MED ORDER — SODIUM CHLORIDE 0.9 % IV SOLN
500.0000 mL | Freq: Once | INTRAVENOUS | Status: DC
Start: 2023-10-02 — End: 2023-10-02

## 2023-10-02 MED ORDER — BUPROPION HCL ER (XL) 150 MG PO TB24: 150.0000 mg | ORAL_TABLET | Freq: Every day | ORAL | 0 refills | Status: DC

## 2023-10-02 NOTE — Progress Notes (Signed)
 Pt's states no medical or surgical changes since previsit or office visit.

## 2023-10-02 NOTE — Progress Notes (Signed)
 Sedate, gd SR, tolerated procedure well, VSS, report to RN

## 2023-10-02 NOTE — Patient Instructions (Addendum)
 Resume previous diet Continue present medications There were no colon polyps seen today!  You will need another screening colonoscopy in 10 years, you will receive a letter at that time when you are due for the procedure.   Please call us at [phone] if you have a change in bowel habits, change in family history of colo-rectal cancer, rectal bleeding or other GI concern before that time.  Handouts/information given for high fiber diet, diverticulosis and hemorrhoids  YOU HAD AN ENDOSCOPIC PROCEDURE TODAY AT THE [facility]:   Refer to the procedure report that was given to you for any specific questions about what was found during the examination.  If the procedure report does not answer your questions, please call your gastroenterologist to clarify.  If you requested that your care partner not be given the details of your procedure findings, then the procedure report has been included in a sealed envelope for you to review at your convenience later.  YOU SHOULD EXPECT: Some feelings of bloating in the abdomen. Passage of more gas than usual.  Walking can help get rid of the air that was put into your GI tract during the procedure and reduce the bloating. If you had a lower endoscopy (such as a colonoscopy or flexible sigmoidoscopy) you may notice spotting of blood in your stool or on the toilet paper. If you underwent a bowel prep for your procedure, you may not have a normal bowel movement for a few days.  Please Note:  You might notice some irritation and congestion in your nose or some drainage.  This is from the oxygen used during your procedure.  There is no need for concern and it should clear up in a day or so.  SYMPTOMS TO REPORT IMMEDIATELY:  Following lower endoscopy (colonoscopy):  Excessive amounts of blood in the stool  Significant tenderness or worsening of abdominal pains  Swelling of the abdomen that is new, acute  Fever of 100F or higher  For urgent or  emergent issues, a gastroenterologist can be reached at any hour by calling [phone]. Do not use MyChart messaging for urgent concerns.   DIET:  We do recommend a small meal at first, but then you may proceed to your regular diet.  Drink plenty of fluids but you should avoid alcoholic beverages for 24 hours.  ACTIVITY:  You should plan to take it easy for the rest of today and you should NOT DRIVE or use heavy machinery until tomorrow (because of the sedation medicines used during the test).    FOLLOW UP: Our staff will call the number listed on your records the next business day following your procedure.  We will call around 7:15- 8:00 am to check on you and address any questions or concerns that you may have regarding the information given to you following your procedure. If we do not reach you, we will leave a message.      SIGNATURES/CONFIDENTIALITY: You and/or your care partner have signed paperwork which will be entered into your electronic medical record.  These signatures attest to the fact that that the information above on your After Visit Summary has been reviewed and is understood.  Full responsibility of the confidentiality of this discharge information lies with you and/or your care-partner.

## 2023-10-02 NOTE — Op Note (Addendum)
 [facility] Patient Name: Andrea Oneal Procedure Date: 10/02/2023 11:43 AM MRN: [id number] Endoscopist: Lajuan Pila , MD, [id number] Age: 63 Referring MD:  Date of Birth: May 06, 1960 Gender: Female Account #: [id number] Procedure:                Colonoscopy Indications:              Screening for colorectal malignant neoplasm Medicines:                Monitored Anesthesia Care Procedure:                Pre-Anesthesia Assessment:                           - Prior to the procedure, a History and Physical                            was performed, and patient medications and                            allergies were reviewed. The patient's tolerance of                            previous anesthesia was also reviewed. The risks                            and benefits of the procedure and the sedation                            options and risks were discussed with the patient.                            All questions were answered, and informed consent                            was obtained. Prior Anticoagulants: The patient has                            taken no anticoagulant or antiplatelet agents. ASA                            Grade Assessment: II - A patient with mild systemic                            disease. After reviewing the risks and benefits,                            the patient was deemed in satisfactory condition to                            undergo the procedure.                           After obtaining informed consent, the colonoscope  was passed under direct vision. Throughout the                            procedure, the patient's blood pressure, pulse, and                            oxygen saturations were monitored continuously. The                            CF [id number] #[id number] was introduced through the anus                            and advanced to the the cecum, identified by                            appendiceal orifice  and ileocecal valve. The                            colonoscopy was performed without difficulty. The                            patient tolerated the procedure well. The quality                            of the bowel preparation was good. The ileocecal                            valve, appendiceal orifice, and rectum were                            photographed. Scope In: 11:57:11 AM Scope Out: 12:05:56 PM Scope Withdrawal Time: 0 hours 5 minutes 58 seconds  Total Procedure Duration: 0 hours 8 minutes 45 seconds  Findings:                 Many small-mouthed diverticula were found in the                            sigmoid colon and descending colon.                           Non-bleeding internal hemorrhoids were found during                            retroflexion. The hemorrhoids were small and Grade                            I (internal hemorrhoids that do not prolapse).                           The exam was otherwise without abnormality on                            direct and retroflexion views. Complications:  No immediate complications. Estimated Blood Loss:     Estimated blood loss: none. Impression:               - Mild left colonic diverticulosis.                           - Non-bleeding internal hemorrhoids.                           - The examination was otherwise normal on direct                            and retroflexion views.                           - No specimens collected. Recommendation:           - Patient has a contact number available for                            emergencies. The signs and symptoms of potential                            delayed complications were discussed with the                            patient. Return to normal activities tomorrow.                            Written discharge instructions were provided to the                            patient.                           - High fiber diet.                           -  Continue present medications.                           - Repeat colonoscopy in 10 years for screening                            purposes. Earlier if any new problems or change in                            family history                           - The findings and recommendations were discussed                            with the patient (HIPAA). Lajuan Pila, MD 10/02/2023 12:09:32 PM This report has been signed electronically.

## 2023-10-02 NOTE — Progress Notes (Signed)
 [facility] History and Physical   Primary Care Physician:  Colene Dauphin, MD   Reason for Procedure:   CRC screening  Plan:    Colon      HPI: NESHIA MCKENZIE is a 63 y.o. female    Past Medical History:  Diagnosis Date   Allergy    Allergy to alpha-gal    Anxiety    Arthritis    Depression    Hypertension    Migraine     Past Surgical History:  Procedure Laterality Date   APPENDECTOMY     KIDNEY SURGERY     age 63 extra tuve per pt removed   TONSILLECTOMY  1986    Prior to Admission medications   Medication Sig Start Date End Date Taking? Authorizing Provider  ALPRAZolam  (XANAX ) 0.5 MG tablet Take 1 tablet (0.5 mg total) by mouth at bedtime as needed for anxiety. 03/31/23  Yes Burns, Beckey Bourgeois, MD  amLODipine  (NORVASC ) 5 MG tablet TAKE 1 TABLET (5 MG TOTAL) BY MOUTH DAILY. 06/29/23  Yes Burns, Beckey Bourgeois, MD  buPROPion  (WELLBUTRIN  XL) 150 MG 24 hr tablet Take 1 tablet (150 mg total) by mouth daily. 05/21/23  Yes Burns, Beckey Bourgeois, MD  losartan -hydrochlorothiazide (HYZAAR) 100-25 MG tablet TAKE 1 TABLET BY MOUTH EVERY DAY 09/23/23  Yes Burns, Beckey Bourgeois, MD  Multiple Vitamin (MULTIVITAMIN PO) Take 1 tablet by mouth daily at 6 (six) AM.   Yes [provider]  potassium chloride  SA (KLOR-CON  M20) 20 MEQ tablet Take 1 tablet (20 mEq total) by mouth 2 (two) times daily. 03/31/23  Yes Burns, Beckey Bourgeois, MD  venlafaxine  XR (EFFEXOR -XR) 75 MG 24 hr capsule Take 1 capsule (75 mg total) by mouth daily with breakfast. 05/15/23  Yes Burns, Beckey Bourgeois, MD  albuterol  (VENTOLIN  HFA) 108 (90 Base) MCG/ACT inhaler Inhale 2 puffs into the lungs every 6 (six) hours as needed for wheezing or shortness of breath. 05/26/23   Starlene Eaton, FNP  benzonatate  (TESSALON ) 100 MG capsule Take 1-2 capsules (100-200 mg total) by mouth 3 (three) times daily as needed. 04/30/23   Angelia Kelp, PA-C  ketorolac  (ACULAR ) 0.5 % ophthalmic solution Place 1 drop into both eyes every 6 (six)  hours. Patient taking differently: Place 1 drop into both eyes every 6 (six) hours as needed. 08/01/22   Gretel Leaven, PA-C  ondansetron  (ZOFRAN ) 4 MG tablet Take 1 tablet (4 mg total) by mouth every 8 (eight) hours as needed for nausea or vomiting. 02/24/22   Mardene Shake, FNP  promethazine -dextromethorphan (PROMETHAZINE -DM) 6.25-15 MG/5ML syrup Take 5 mLs by mouth at bedtime as needed for cough. 05/26/23   Starlene Eaton, FNP  SUMAtriptan  (IMITREX ) 50 MG tablet Take 1 tablet (50 mg total) by mouth once as needed for up to 1 dose for migraine. May repeat in 2 hours if headache persists or recurs. 10/10/20   Colene Dauphin, MD  triamcinolone  cream (KENALOG ) 0.1 % Apply 1 Application topically 2 (two) times daily as needed (eczema). 03/31/23   Colene Dauphin, MD    Current Outpatient Medications  Medication Sig Dispense Refill   ALPRAZolam  (XANAX ) 0.5 MG tablet Take 1 tablet (0.5 mg total) by mouth at bedtime as needed for anxiety. 15 tablet 5   amLODipine  (NORVASC ) 5 MG tablet TAKE 1 TABLET (5 MG TOTAL) BY MOUTH DAILY. 90 tablet 0   buPROPion  (WELLBUTRIN  XL) 150 MG 24 hr tablet Take 1 tablet (150 mg total) by mouth daily. 90 tablet  0   losartan -hydrochlorothiazide (HYZAAR) 100-25 MG tablet TAKE 1 TABLET BY MOUTH EVERY DAY 90 tablet 1   Multiple Vitamin (MULTIVITAMIN PO) Take 1 tablet by mouth daily at 6 (six) AM.     potassium chloride  SA (KLOR-CON  M20) 20 MEQ tablet Take 1 tablet (20 mEq total) by mouth 2 (two) times daily. 180 tablet 1   venlafaxine  XR (EFFEXOR -XR) 75 MG 24 hr capsule Take 1 capsule (75 mg total) by mouth daily with breakfast. 90 capsule 1   albuterol  (VENTOLIN  HFA) 108 (90 Base) MCG/ACT inhaler Inhale 2 puffs into the lungs every 6 (six) hours as needed for wheezing or shortness of breath. 8 g 0   benzonatate  (TESSALON ) 100 MG capsule Take 1-2 capsules (100-200 mg total) by mouth 3 (three) times daily as needed. 30 capsule 0   ketorolac  (ACULAR ) 0.5 % ophthalmic solution  Place 1 drop into both eyes every 6 (six) hours. (Patient taking differently: Place 1 drop into both eyes every 6 (six) hours as needed.) 5 mL 0   ondansetron  (ZOFRAN ) 4 MG tablet Take 1 tablet (4 mg total) by mouth every 8 (eight) hours as needed for nausea or vomiting. 20 tablet 0   promethazine -dextromethorphan (PROMETHAZINE -DM) 6.25-15 MG/5ML syrup Take 5 mLs by mouth at bedtime as needed for cough. 118 mL 0   SUMAtriptan  (IMITREX ) 50 MG tablet Take 1 tablet (50 mg total) by mouth once as needed for up to 1 dose for migraine. May repeat in 2 hours if headache persists or recurs. 10 tablet 5   triamcinolone  cream (KENALOG ) 0.1 % Apply 1 Application topically 2 (two) times daily as needed (eczema). 454 g 1   Current Facility-Administered Medications  Medication Dose Route Frequency Provider Last Rate Last Admin   0.9 %  sodium chloride  infusion  500 mL Intravenous Once Lajuan Pila, MD        Allergies as of 10/02/2023 - Review Complete 10/02/2023  Allergen Reaction Noted   Bactrim  [sulfamethoxazole -trimethoprim ] Other (See Comments) 09/23/2022   Beef (bovine) protein Hives and Itching 03/31/2019    Family History  Problem Relation Age of Onset   Arthritis Mother    Diabetes Mother    Depression Mother    Arthritis Father    Heart disease Father    Hypertension Father    Depression Brother    Arthritis Maternal Grandmother    Diabetes Maternal Grandfather    Arthritis Paternal Grandmother    Colon cancer Neg Hx    Colon polyps Neg Hx    Esophageal cancer Neg Hx    Stomach cancer Neg Hx    Rectal cancer Neg Hx     Social History   Socioeconomic History   Marital status: Divorced    Spouse name: Not on file   Number of children: Not on file   Years of education: Not on file   Highest education level: Not on file  Occupational History   Not on file  Tobacco Use   Smoking status: Former    Current packs/day: 0.00    Average packs/day: 0.3 packs/day for 16.0 years (4.0  ttl pk-yrs)    Types: Cigarettes    Start date: 7    Quit date: 29    Years since quitting: 31.4   Smokeless tobacco: Never  Vaping Use   Vaping status: Never Used  Substance and Sexual Activity   Alcohol use: Yes    Alcohol/week: 6.0 standard drinks of alcohol    Types: 6 Glasses of wine per  week   Drug use: Yes    Frequency: 2.0 times per week    Types: Marijuana   Sexual activity: Not on file  Other Topics Concern   Not on file  Social History Narrative   Has boyfriend, his daughter just moved in -85   Social Drivers of Corporate investment banker Strain: Not on file  Food Insecurity: Not on file  Transportation Needs: Not on file  Physical Activity: Not on file  Stress: Not on file  Social Connections: Not on file  Intimate Partner Violence: Not on file    Review of Systems: Positive for none All other review of systems negative except as mentioned in the HPI.  Physical Exam: Vital signs in last 24 hours: @VSRANGES @   General:   Alert,  Well-developed, well-nourished, pleasant and cooperative in NAD Lungs:  Clear throughout to auscultation.   Heart:  Regular rate and rhythm; no murmurs, clicks, rubs,  or gallops. Abdomen:  Soft, nontender and nondistended. Normal bowel sounds.   Neuro/Psych:  Alert and cooperative. Normal mood and affect. A and O x 3    No significant changes were identified.  The patient continues to be an appropriate candidate for the planned procedure and anesthesia.   Magnus Schuller, MD. [facility] 10/02/2023 12:10 PM@

## 2023-10-04 ENCOUNTER — Encounter: Payer: Self-pay | Admitting: Internal Medicine

## 2023-10-04 NOTE — Patient Instructions (Addendum)

## 2023-10-04 NOTE — Progress Notes (Unsigned)
 Subjective:    Patient ID: Andrea Oneal, female    DOB: [birth year], 63 y.o.   MRN: [id number]      HPI Yadira is here for a Physical exam and her chronic medical problems.    Doing great - no concerns.    Medications and allergies reviewed with patient and updated if appropriate.  Current Outpatient Medications on File Prior to Visit  Medication Sig Dispense Refill   amLODipine  (NORVASC ) 5 MG tablet TAKE 1 TABLET (5 MG TOTAL) BY MOUTH DAILY. 90 tablet 0   buPROPion  (WELLBUTRIN  XL) 150 MG 24 hr tablet Take 1 tablet (150 mg total) by mouth daily. 90 tablet 0   ketorolac  (ACULAR ) 0.5 % ophthalmic solution Place 1 drop into both eyes every 6 (six) hours. (Patient taking differently: Place 1 drop into both eyes every 6 (six) hours as needed.) 5 mL 0   losartan -hydrochlorothiazide (HYZAAR) 100-25 MG tablet TAKE 1 TABLET BY MOUTH EVERY DAY 90 tablet 1   Multiple Vitamin (MULTIVITAMIN PO) Take 1 tablet by mouth daily at 6 (six) AM.     ondansetron  (ZOFRAN ) 4 MG tablet Take 1 tablet (4 mg total) by mouth every 8 (eight) hours as needed for nausea or vomiting. 20 tablet 0   potassium chloride  SA (KLOR-CON  M20) 20 MEQ tablet Take 1 tablet (20 mEq total) by mouth 2 (two) times daily. 180 tablet 1   SUMAtriptan  (IMITREX ) 50 MG tablet Take 1 tablet (50 mg total) by mouth once as needed for up to 1 dose for migraine. May repeat in 2 hours if headache persists or recurs. 10 tablet 5   triamcinolone  cream (KENALOG ) 0.1 % Apply 1 Application topically 2 (two) times daily as needed (eczema). 454 g 1   venlafaxine  XR (EFFEXOR -XR) 75 MG 24 hr capsule Take 1 capsule (75 mg total) by mouth daily with breakfast. 90 capsule 1   No current facility-administered medications on file prior to visit.    Review of Systems  Constitutional:  Negative for fever.  Eyes:  Negative for visual disturbance.  Respiratory:  Negative for cough, shortness of breath and wheezing.   Cardiovascular:  Negative for  chest pain, palpitations and leg swelling.  Gastrointestinal:  Negative for abdominal pain, blood in stool, constipation and diarrhea.       Occ gerd  Genitourinary:  Negative for dysuria.  Musculoskeletal:  Positive for arthralgias (shoulder). Negative for back pain.  Skin:  Negative for rash.  Neurological:  Negative for dizziness, light-headedness and headaches.  Psychiatric/Behavioral:  Negative for dysphoric mood. The patient is not nervous/anxious.        Objective:   Vitals:   10/05/23 1451  BP: 126/74  Pulse: 78  Temp: 98.2 F (36.8 C)  SpO2: 94%   Filed Weights   10/05/23 1451  Weight: 194 lb (88 kg)   Body mass index is 31.31 kg/m.  BP Readings from Last 3 Encounters:  10/05/23 126/74  10/02/23 114/64  05/26/23 139/85    Wt Readings from Last 3 Encounters:  10/05/23 194 lb (88 kg)  10/02/23 194 lb (88 kg)  09/15/23 194 lb (88 kg)       Physical Exam Constitutional: She appears well-developed and well-nourished. No distress.  HENT:  Head: Normocephalic and atraumatic.  Right Ear: External ear normal. Normal ear canal and TM Left Ear: External ear normal.  Normal ear canal and TM Mouth/Throat: Oropharynx is clear and moist.  Eyes: Conjunctivae normal.  Neck: Neck supple. No tracheal deviation  present. No thyromegaly present.  No carotid bruit  Cardiovascular: Normal rate, regular rhythm and normal heart sounds.   No murmur heard.  No edema. Pulmonary/Chest: Effort normal and breath sounds normal. No respiratory distress. She has no wheezes. She has no rales.  Breast: deferred   Abdominal: Soft. She exhibits no distension. There is no tenderness.  Lymphadenopathy: She has no cervical adenopathy.  Skin: Skin is warm and dry. She is not diaphoretic.  Psychiatric: She has a normal mood and affect. Her behavior is normal.     Lab Results  Component Value Date   WBC 6.2 09/23/2022   HGB 13.5 09/23/2022   HCT 40.2 09/23/2022   PLT 285.0 09/23/2022    GLUCOSE 116 (H) 03/31/2023   CHOL 202 (H) 03/31/2023   TRIG 125.0 03/31/2023   HDL 49.40 03/31/2023   LDLCALC 128 (H) 03/31/2023   ALT 22 03/31/2023   AST 18 03/31/2023   NA 137 03/31/2023   K 3.6 03/31/2023   CL 101 03/31/2023   CREATININE 0.69 03/31/2023   BUN 15 03/31/2023   CO2 28 03/31/2023   TSH 2.47 09/23/2022   HGBA1C 5.7 03/31/2023         Assessment & Plan:   Physical exam: Screening blood work  ordered Exercise  moderate - walking, gardening Weight obese-encouraged weight loss Substance abuse  none   Reviewed recommended immunizations.   Health Maintenance  Topic Date Due   Zoster Vaccines- Shingrix (1 of 2) Never done   COVID-19 Vaccine (4 - 2024-25 season) 10/21/2023 (Originally 12/28/2022)   INFLUENZA VACCINE  11/27/2023   Cervical Cancer Screening (HPV/Pap Cotest)  05/02/2024   MAMMOGRAM  10/12/2024   DTaP/Tdap/Td (2 - Td or Tdap) 03/31/2029   Colonoscopy  10/01/2033   Hepatitis C Screening  Completed   HIV Screening  Completed   HPV VACCINES  Aged Out   Meningococcal B Vaccine  Aged Out          See Problem List for Assessment and Plan of chronic medical problems.

## 2023-10-05 ENCOUNTER — Telehealth: Payer: Self-pay

## 2023-10-05 ENCOUNTER — Ambulatory Visit (INDEPENDENT_AMBULATORY_CARE_PROVIDER_SITE_OTHER): Admitting: Internal Medicine

## 2023-10-05 VITALS — BP 126/74 | HR 78 | Temp 98.2°F | Ht 66.0 in | Wt 194.0 lb

## 2023-10-05 DIAGNOSIS — R7303 Prediabetes: Secondary | ICD-10-CM

## 2023-10-05 DIAGNOSIS — I10 Essential (primary) hypertension: Secondary | ICD-10-CM | POA: Diagnosis not present

## 2023-10-05 DIAGNOSIS — F3289 Other specified depressive episodes: Secondary | ICD-10-CM | POA: Diagnosis not present

## 2023-10-05 DIAGNOSIS — E782 Mixed hyperlipidemia: Secondary | ICD-10-CM | POA: Diagnosis not present

## 2023-10-05 DIAGNOSIS — F419 Anxiety disorder, unspecified: Secondary | ICD-10-CM | POA: Diagnosis not present

## 2023-10-05 DIAGNOSIS — Z0000 Encounter for general adult medical examination without abnormal findings: Secondary | ICD-10-CM | POA: Diagnosis not present

## 2023-10-05 DIAGNOSIS — G43809 Other migraine, not intractable, without status migrainosus: Secondary | ICD-10-CM

## 2023-10-05 LAB — LIPID PANEL
Cholesterol: 201 mg/dL — ABNORMAL HIGH (ref 0–200)
HDL: 50.1 mg/dL (ref >39.00)
LDL Cholesterol: 113 mg/dL — ABNORMAL HIGH (ref 0–99)
NonHDL: 150.83
Total CHOL/HDL Ratio: 4
Triglycerides: 187 mg/dL — ABNORMAL HIGH (ref 0.0–149.0)
VLDL: 37.4 mg/dL (ref 0.0–40.0)

## 2023-10-05 LAB — CBC WITH DIFFERENTIAL/PLATELET
Basophils Absolute: 0.1 10*3/uL (ref 0.0–0.1)
Basophils Relative: 1.2 % (ref 0.0–3.0)
Eosinophils Absolute: 0.4 10*3/uL (ref 0.0–0.7)
Eosinophils Relative: 6 % — ABNORMAL HIGH (ref 0.0–5.0)
HCT: 39.3 % (ref 36.0–46.0)
Hemoglobin: 13.1 g/dL (ref 12.0–15.0)
Lymphocytes Relative: 31.1 % (ref 12.0–46.0)
Lymphs Abs: 2.1 10*3/uL (ref 0.7–4.0)
MCHC: 33.4 g/dL (ref 30.0–36.0)
MCV: 87.5 fl (ref 78.0–100.0)
Monocytes Absolute: 0.4 10*3/uL (ref 0.1–1.0)
Monocytes Relative: 6.7 % (ref 3.0–12.0)
Neutro Abs: 3.7 10*3/uL (ref 1.4–7.7)
Neutrophils Relative %: 55 % (ref 43.0–77.0)
Platelets: 285 10*3/uL (ref 150.0–400.0)
RBC: 4.49 Mil/uL (ref 3.87–5.11)
RDW: 12.9 % (ref 11.5–15.5)
WBC: 6.7 10*3/uL (ref 4.0–10.5)

## 2023-10-05 LAB — COMPREHENSIVE METABOLIC PANEL WITH GFR
ALT: 22 U/L (ref 0–35)
AST: 18 U/L (ref 0–37)
Albumin: 4.5 g/dL (ref 3.5–5.2)
Alkaline Phosphatase: 81 U/L (ref 39–117)
BUN: 15 mg/dL (ref 6–23)
CO2: 31 meq/L (ref 19–32)
Calcium: 9.5 mg/dL (ref 8.4–10.5)
Chloride: 97 meq/L (ref 96–112)
Creatinine, Ser: 0.71 mg/dL (ref 0.40–1.20)
GFR: 91.15 mL/min (ref >60.00)
Glucose, Bld: 88 mg/dL (ref 70–99)
Potassium: 3.7 meq/L (ref 3.5–5.1)
Sodium: 136 meq/L (ref 135–145)
Total Bilirubin: 0.4 mg/dL (ref 0.2–1.2)
Total Protein: 7.5 g/dL (ref 6.0–8.3)

## 2023-10-05 MED ORDER — ALPRAZOLAM 0.5 MG PO TABS: 0.5000 mg | ORAL_TABLET | Freq: Every evening | ORAL | 5 refills | Status: DC | PRN

## 2023-10-05 NOTE — Assessment & Plan Note (Signed)
 Chronic Lab Results  Component Value Date   HGBA1C 5.7 03/31/2023   Check a1c Low sugar / carb diet Stressed regular exercise

## 2023-10-05 NOTE — Telephone Encounter (Signed)
Attempted to reach patient for post-procedure f/u call. No answer. Left message for her to please not hesitate to call if she has any questions/concerns regarding her care. 

## 2023-10-05 NOTE — Assessment & Plan Note (Signed)
 Chronic BP controlled Continue losartan-hydrochlorothiazide 100-25 mg daily, amlodipine 5 mg daily Cmp, cbc

## 2023-10-05 NOTE — Assessment & Plan Note (Signed)
chronic Infrequent migraines Continue Imitrex 50 mg daily as needed, Zofran 4 mg as needed

## 2023-10-05 NOTE — Assessment & Plan Note (Signed)
 Chronic Controlled, Stable Continue Wellbutrin xl 150 mg daily, Effexor 75 mg daily

## 2023-10-05 NOTE — Assessment & Plan Note (Signed)
 Chronic Controlled, Stable Continue Wellbutrin xl 150 mg daily, Effexor 75 mg daily, alprazolam 0.5 mg daily as needed

## 2023-10-05 NOTE — Assessment & Plan Note (Signed)
 Chronic Regular exercise and healthy diet encouraged Check lipid panel , cmp Continue lifestyle control

## 2023-10-06 LAB — HEMOGLOBIN A1C: Hgb A1c MFr Bld: 5.6 % (ref 4.6–6.5)

## 2023-10-08 ENCOUNTER — Ambulatory Visit: Payer: Self-pay | Admitting: Internal Medicine

## 2023-10-08 ENCOUNTER — Encounter: Payer: Self-pay | Admitting: Internal Medicine

## 2023-10-08 LAB — TSH: TSH: 3.02 u[IU]/mL (ref 0.35–5.50)

## 2023-10-29 ENCOUNTER — Other Ambulatory Visit: Payer: Self-pay | Admitting: Internal Medicine

## 2023-10-29 ENCOUNTER — Encounter: Payer: Self-pay | Admitting: Internal Medicine

## 2023-11-02 ENCOUNTER — Telehealth: Payer: Self-pay

## 2023-12-03 ENCOUNTER — Other Ambulatory Visit: Payer: Self-pay | Admitting: Internal Medicine

## 2023-12-03 DIAGNOSIS — Z1231 Encounter for screening mammogram for malignant neoplasm of breast: Secondary | ICD-10-CM

## 2023-12-11 ENCOUNTER — Other Ambulatory Visit: Payer: Self-pay | Admitting: Medical Genetics

## 2023-12-11 NOTE — Telephone Encounter (Signed)
 Done

## 2023-12-15 ENCOUNTER — Ambulatory Visit
Admission: RE | Admit: 2023-12-15 | Discharge: 2023-12-15 | Disposition: A | Discharge status: Home / Self Care | Payer: Self-pay | Source: Ambulatory Visit | Attending: Internal Medicine | Admitting: Internal Medicine

## 2023-12-15 DIAGNOSIS — Z1231 Encounter for screening mammogram for malignant neoplasm of breast: Secondary | ICD-10-CM

## 2023-12-18 DIAGNOSIS — Z1231 Encounter for screening mammogram for malignant neoplasm of breast: Secondary | ICD-10-CM

## 2023-12-30 ENCOUNTER — Other Ambulatory Visit: Payer: Self-pay | Admitting: Internal Medicine

## 2024-02-19 ENCOUNTER — Other Ambulatory Visit: Payer: Self-pay | Admitting: Medical Genetics

## 2024-02-19 DIAGNOSIS — Z006 Encounter for examination for normal comparison and control in clinical research program: Secondary | ICD-10-CM

## 2024-03-27 ENCOUNTER — Other Ambulatory Visit: Payer: Self-pay | Admitting: Internal Medicine

## 2024-04-20 ENCOUNTER — Other Ambulatory Visit: Payer: Self-pay | Admitting: Internal Medicine

## 2024-05-06 ENCOUNTER — Other Ambulatory Visit: Payer: Self-pay | Admitting: Internal Medicine

## 2024-05-23 ENCOUNTER — Other Ambulatory Visit: Payer: Self-pay | Admitting: Internal Medicine

## 2024-05-24 ENCOUNTER — Ambulatory Visit
Admission: EM | Admit: 2024-05-24 | Discharge: 2024-05-24 | Disposition: A | Discharge status: Home / Self Care | Attending: Emergency Medicine | Admitting: Emergency Medicine

## 2024-05-24 ENCOUNTER — Encounter: Payer: Self-pay | Admitting: Emergency Medicine

## 2024-05-24 DIAGNOSIS — J111 Influenza due to unidentified influenza virus with other respiratory manifestations: Secondary | ICD-10-CM | POA: Diagnosis not present

## 2024-05-24 DIAGNOSIS — Z20828 Contact with and (suspected) exposure to other viral communicable diseases: Secondary | ICD-10-CM | POA: Diagnosis not present

## 2024-05-24 DIAGNOSIS — S0990XA Unspecified injury of head, initial encounter: Secondary | ICD-10-CM | POA: Diagnosis not present

## 2024-05-24 DIAGNOSIS — L03811 Cellulitis of head [any part, except face]: Secondary | ICD-10-CM

## 2024-05-24 LAB — POC SOFIA SARS ANTIGEN FIA: SARS Coronavirus 2 Ag: NEGATIVE

## 2024-05-24 LAB — POCT INFLUENZA A/B
Influenza A, POC: NEGATIVE
Influenza B, POC: NEGATIVE

## 2024-05-24 MED ORDER — CEPHALEXIN 500 MG PO CAPS: 1000.0000 mg | ORAL_CAPSULE | Freq: Two times a day (BID) | ORAL | 0 refills | Status: AC

## 2024-05-24 MED ORDER — OSELTAMIVIR PHOSPHATE 75 MG PO CAPS: 75.0000 mg | ORAL_CAPSULE | Freq: Two times a day (BID) | ORAL | 0 refills | Status: AC

## 2024-05-24 MED ORDER — ONDANSETRON 8 MG PO TBDP: ORAL_TABLET | ORAL | 0 refills | Status: AC
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
## 2024-05-24 NOTE — Discharge Instructions (Signed)
 For your scalp: Keflex  1000 mg p.o. twice daily for 5 days.  Continue warm compresses.  600 mg of ibuprofen combined with 1000 mg of Tylenol 3-4 times a day as needed for pain.  For your influenza like illness.  COVID, flu were both negative.  It may be too early for the test to pick up influenza.  I am sending you home with a wait-and-see prescription of Tamiflu .  Repeat test tomorrow at home, and if it is positive, then start the Tamiflu .  Push electrolyte containing fluids such as Pedialyte or Gatorade light.  Zofran  as needed for nausea and vomiting.  Go to the ER if you get worse, or for any other concerns.
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
## 2024-06-06 ENCOUNTER — Ambulatory Visit: Admitting: Internal Medicine
